# Patient Record
Sex: Male | Born: 1960 | Race: White | Hispanic: No | Marital: Single | Smoking: Current every day smoker
Health system: Southern US, Community
[De-identification: ages and names within clinical notes are randomized; demographics above are authoritative.]

---

## 2006-08-16 ENCOUNTER — Ambulatory Visit: Payer: Self-pay | Admitting: Internal Medicine

## 2006-08-17 ENCOUNTER — Ambulatory Visit: Payer: Self-pay | Admitting: *Deleted

## 2006-08-17 ENCOUNTER — Ambulatory Visit (HOSPITAL_COMMUNITY): Admission: RE | Admit: 2006-08-17 | Discharge: 2006-08-17 | Payer: Self-pay | Admitting: Internal Medicine

## 2006-10-12 ENCOUNTER — Emergency Department (HOSPITAL_COMMUNITY): Admission: EM | Admit: 2006-10-12 | Discharge: 2006-10-12 | Payer: Self-pay | Admitting: Family Medicine

## 2015-11-14 ENCOUNTER — Emergency Department (HOSPITAL_COMMUNITY)
Admission: EM | Admit: 2015-11-14 | Discharge: 2015-11-14 | Disposition: A | Discharge status: Home / Self Care | Payer: Self-pay | Attending: Emergency Medicine | Admitting: Emergency Medicine

## 2015-11-14 ENCOUNTER — Encounter (HOSPITAL_COMMUNITY): Payer: Self-pay | Admitting: *Deleted

## 2015-11-14 ENCOUNTER — Encounter (HOSPITAL_COMMUNITY): Payer: Self-pay | Admitting: Emergency Medicine

## 2015-11-14 ENCOUNTER — Ambulatory Visit (HOSPITAL_COMMUNITY)
Admission: EM | Admit: 2015-11-14 | Discharge: 2015-11-14 | Disposition: A | Discharge status: Nursing Home (Includes ICF) | Payer: Self-pay

## 2015-11-14 DIAGNOSIS — B182 Chronic viral hepatitis C: Secondary | ICD-10-CM

## 2015-11-14 DIAGNOSIS — R899 Unspecified abnormal finding in specimens from other organs, systems and tissues: Secondary | ICD-10-CM

## 2015-11-14 DIAGNOSIS — F17200 Nicotine dependence, unspecified, uncomplicated: Secondary | ICD-10-CM | POA: Insufficient documentation

## 2015-11-14 DIAGNOSIS — R799 Abnormal finding of blood chemistry, unspecified: Secondary | ICD-10-CM | POA: Insufficient documentation

## 2015-11-14 LAB — COMPREHENSIVE METABOLIC PANEL
ALBUMIN: 4.3 g/dL (ref 3.5–5.0)
ALK PHOS: 74 U/L (ref 38–126)
ALT: 58 U/L (ref 17–63)
AST: 48 U/L — AB (ref 15–41)
Anion gap: 8 (ref 5–15)
BILIRUBIN TOTAL: 0.9 mg/dL (ref 0.3–1.2)
BUN: 14 mg/dL (ref 6–20)
CALCIUM: 9.6 mg/dL (ref 8.9–10.3)
CO2: 28 mmol/L (ref 22–32)
CREATININE: 0.94 mg/dL (ref 0.61–1.24)
Chloride: 101 mmol/L (ref 101–111)
GFR calc Af Amer: >60 mL/min (ref >60)
GFR calc non Af Amer: >60 mL/min (ref >60)
GLUCOSE: 117 mg/dL — AB (ref 65–99)
POTASSIUM: 4 mmol/L (ref 3.5–5.1)
Sodium: 137 mmol/L (ref 135–145)
TOTAL PROTEIN: 7.2 g/dL (ref 6.5–8.1)

## 2015-11-14 LAB — CBC
HEMATOCRIT: 50 % (ref 39.0–52.0)
HEMOGLOBIN: 17.2 g/dL — AB (ref 13.0–17.0)
MCH: 31 pg (ref 26.0–34.0)
MCHC: 34.4 g/dL (ref 30.0–36.0)
MCV: 90.1 fL (ref 78.0–100.0)
Platelets: 220 10*3/uL (ref 150–400)
RBC: 5.55 MIL/uL (ref 4.22–5.81)
RDW: 13.6 % (ref 11.5–15.5)
WBC: 10.1 10*3/uL (ref 4.0–10.5)

## 2015-11-14 NOTE — ED Notes (Signed)
Patient sent by Dr. Melrose NakayamaKindle [facility] for HEP C workup.   Patient was told when he went to give blood that he is positive for same.

## 2015-11-14 NOTE — ED Provider Notes (Addendum)
[facility]    CSN: [id number] Arrival date & time: 11/14/15  1005  First Provider Contact:  First MD Initiated Contact with Patient 11/14/15 1048        History   Chief Complaint No chief complaint on file.   HPI Vaughan SineRonald W Quang is a 55 y.o. male.    Illness  Severity:  Mild Chronicity:  New Context:  Told 3mos ago he had hep C and now he wants treatment to cure. Associated symptoms: fatigue and nausea   Associated symptoms: no abdominal pain, no fever and no rash     No past medical history on file.  There are no active problems to display for this patient.   No past surgical history on file.     Home Medications    Prior to Admission medications   Not on File    Family History No family history on file.  Social History Social History  Substance Use Topics  . Smoking status: Not on file  . Smokeless tobacco: Not on file  . Alcohol use Not on file     Allergies   Review of patient's allergies indicates not on file.   Review of Systems Review of Systems  Constitutional: Positive for fatigue and unexpected weight change. Negative for fever.  Respiratory: Negative.   Cardiovascular: Negative.   Gastrointestinal: Positive for nausea. Negative for abdominal pain.  Skin: Negative for rash.  Neurological: Positive for weakness.  All other systems reviewed and are negative.    Physical Exam Triage Vital Signs ED Triage Vitals  Enc Vitals Group     BP      Pulse      Resp      Temp      Temp src      SpO2      Weight      Height      Head Circumference      Peak Flow      Pain Score      Pain Loc      Pain Edu?      Excl. in GC?    No data found.   Updated Vital Signs There were no vitals taken for this visit.  Visual Acuity Right Eye Distance:   Left Eye Distance:   Bilateral Distance:    Right Eye Near:   Left Eye Near:    Bilateral Near:     Physical Exam  Constitutional: He is oriented to person,  place, and time. He appears well-developed and well-nourished.  Eyes: Conjunctivae are normal.  Neck: Normal range of motion. Neck supple.  Cardiovascular: Normal rate, regular rhythm and normal heart sounds.   Abdominal: Soft. Bowel sounds are normal. He exhibits no mass. There is no tenderness.  Lymphadenopathy:    He has no cervical adenopathy.  Neurological: He is alert and oriented to person, place, and time.  Skin: Skin is warm.  Nursing note and vitals reviewed.    UC Treatments / Results  Labs (all labs ordered are listed, but only abnormal results are displayed) Labs Reviewed - No data to display  EKG  EKG Interpretation None       Radiology No results found.  Procedures Procedures (including critical care time)  Medications Ordered in UC Medications - No data to display   Initial Impression / Assessment and Plan / UC Course  I have reviewed the triage vital signs and the nursing notes.  Pertinent labs & imaging results that were available  during my care of the patient were reviewed by me and considered in my medical decision making (see chart for details).  Clinical Course    Sent for eval of poss hepc.  Final Clinical Impressions(s) / UC Diagnoses   Final diagnoses:  None    New Prescriptions New Prescriptions   No medications on file     Linna Hoff, MD 11/14/15 1107    Linna Hoff, MD 11/14/15 1109

## 2015-11-14 NOTE — ED Triage Notes (Signed)
Pt  Reports weakness   Pt  Was  Told  By    [facility]   [facility]  He  Had  Hep  c    He  Reports   He  Has  Had  A  Foul  Odor  At  Time  When  He  Was   Sweating     He  Is   awke  And  Alert  And  Oriented

## 2015-11-14 NOTE — ED Triage Notes (Signed)
Went to give plasma and was told he had Hep C,  Needs to get set up with dr,

## 2015-11-14 NOTE — ED Notes (Signed)
Report  Phoned  To   Amy  Nurse first    

## 2015-11-14 NOTE — ED Provider Notes (Signed)
[facility]   CSN: [id number] Arrival date & time: 11/14/15  1129  First Provider Contact:  None       History   Chief Complaint Chief Complaint  Patient presents with  . Abnormal Lab    HPI Vaughan SineRonald W Weatherbee is a 55 y.o. male.  Patient presents to the ED with a chief complaint of "I was told I have hepatitis C."  He states that he [facility].  Patient states that he was giving blood a few weeks ago and was told that he had Hep C.  States that his only complaints are that it feels like his "bone marrow is melting" and that he has foul smelling sweat.  He denies fevers, chills, nausea, vomiting, diarrhea.  He states that he has had a mild cough and cold symptoms recently.  He reports having dark urine.  He states that he drinks an occasional beer.   Denies any IV drug use.  Denies any known contacts/exposures to Hep C.   The history is provided by the patient. No language interpreter was used.    History reviewed. No pertinent past medical history.  There are no active problems to display for this patient.   History reviewed. No pertinent surgical history.     Home Medications    Prior to Admission medications   Not on File    Family History History reviewed. No pertinent family history.  Social History Social History  Substance Use Topics  . Smoking status: Current Every Day Smoker  . Smokeless tobacco: Never Used  . Alcohol use No     Allergies   Shellfish allergy   Review of Systems Review of Systems  HENT: Positive for rhinorrhea.   Respiratory: Positive for cough.   All other systems reviewed and are negative.    Physical Exam Updated Vital Signs BP 106/69 (BP Location: Right Arm)   Pulse 73   Temp 99.6 F (37.6 C) (Oral)   Resp 18   SpO2 98%   Physical Exam Physical Exam  Constitutional: Pt appears well-developed and well-nourished. No distress.  Awake, alert, nontoxic  appearance  HENT:  Head: Normocephalic and atraumatic.  Mouth/Throat: Oropharynx is clear and moist. No oropharyngeal exudate.  Eyes: Conjunctivae are normal. No scleral icterus.  Neck: Normal range of motion. Neck supple.  Cardiovascular: Normal rate, regular rhythm and intact distal pulses.   Pulmonary/Chest: Effort normal and breath sounds normal. No respiratory distress. Pt has no wheezes.  Equal chest expansion  Abdominal: Soft. Bowel sounds are normal. Pt exhibits no mass. No focal abdominal tenderness, no RLQ tenderness or pain at McBurney's point, no RUQ tenderness or Murphy's sign, no left-sided abdominal tenderness, no fluid wave, or signs of peritonitis  Musculoskeletal: Normal range of motion. Pt exhibits no edema.  Neurological: Pt is alert.  Speech is clear and goal oriented Moves extremities without ataxia  Skin: Skin is warm and dry. Pt is not diaphoretic.  Psychiatric: Pt has a normal mood and affect.  Nursing note and vitals reviewed.    ED Treatments / Results  Labs (all labs ordered are listed, but only abnormal results are displayed) Labs Reviewed  CBC - Abnormal; Notable for the following:       Result Value   Hemoglobin 17.2 (*)    All other components within normal limits  COMPREHENSIVE METABOLIC PANEL - Abnormal; Notable for the following:    Glucose, Bld 117 (*)  AST 48 (*)    All other components within normal limits  HEPATITIS PANEL, ACUTE    EKG  EKG Interpretation None       Radiology No results found.  Procedures Procedures (including critical care time)  Medications Ordered in ED Medications - No data to display   Initial Impression / Assessment and Plan / ED Course  I have reviewed the triage vital signs and the nursing notes.  Pertinent labs & imaging results that were available during my care of the patient were reviewed by me and considered in my medical decision making (see chart for details).  Clinical Course     Patient sent to ED by [facility] for Hep C workup.  Will check basic labs to ensure to failure.  Check Hep Panel and discuss with Case Management and help facilitate patient seeing GI.  Patient to call and speak with social work on Monday morning to renew orange card, then contact GI.    Hepatitis panel ordered to assist GI.  LFTs today on non-concerning.  Final Clinical Impressions(s) / ED Diagnoses   Final diagnoses:  Abnormal laboratory test    New Prescriptions New Prescriptions   No medications on file     Roxy Horseman, PA-C 11/14/15 1836    Marily Memos, MD 11/15/15 9604

## 2015-11-15 LAB — HEPATITIS PANEL, ACUTE
HCV Ab: >11 s/co ratio — ABNORMAL HIGH (ref 0.0–0.9)
Hep A IgM: NEGATIVE
Hep B C IgM: NEGATIVE
Hepatitis B Surface Ag: NEGATIVE

## 2015-11-17 ENCOUNTER — Telehealth (HOSPITAL_BASED_OUTPATIENT_CLINIC_OR_DEPARTMENT_OTHER): Payer: Self-pay | Admitting: Emergency Medicine

## 2015-11-17 NOTE — Telephone Encounter (Signed)
+   AntiHCV resulted, according to notes, was sent for workup for hepatitis, referred to GI MD for followup

## 2017-07-13 ENCOUNTER — Encounter (HOSPITAL_COMMUNITY): Payer: Self-pay | Admitting: Emergency Medicine

## 2017-07-13 ENCOUNTER — Ambulatory Visit (HOSPITAL_COMMUNITY)
Admission: EM | Admit: 2017-07-13 | Discharge: 2017-07-13 | Disposition: A | Discharge status: Home / Self Care | Payer: Self-pay | Attending: Family Medicine | Admitting: Family Medicine

## 2017-07-13 ENCOUNTER — Other Ambulatory Visit: Payer: Self-pay

## 2017-07-13 DIAGNOSIS — J0100 Acute maxillary sinusitis, unspecified: Secondary | ICD-10-CM

## 2017-07-13 DIAGNOSIS — L03113 Cellulitis of right upper limb: Secondary | ICD-10-CM

## 2017-07-13 MED ORDER — AMOXICILLIN-POT CLAVULANATE 875-125 MG PO TABS: 1.0000 | ORAL_TABLET | Freq: Two times a day (BID) | ORAL | 0 refills | Status: DC

## 2017-07-13 NOTE — ED Provider Notes (Signed)
[facility]   [id number] 07/13/17 Arrival Time: 1025  ASSESSMENT & PLAN:  1. Acute non-recurrent maxillary sinusitis   2. Cellulitis of right arm     Meds ordered this encounter  Medications  . amoxicillin-clavulanate (AUGMENTIN) 875-125 MG tablet    Sig: Take 1 tablet by mouth every 12 (twelve) hours.    Dispense:  20 tablet    Refill:  0   Watch area on R forearm. Will f/u if not seeing improvement within the next 2-3 days, sooner if needed.  Discussed typical duration of symptoms. OTC symptom care as needed. Ensure adequate fluid intake and rest. May f/u with PCP or here as needed.  Reviewed expectations re: course of current medical issues. Questions answered. Outlined signs and symptoms indicating need for more acute intervention. Patient verbalized understanding. After Visit Summary given.   SUBJECTIVE: History from: patient.  Matthew Mills is a 57 y.o. male who presents with complaint of nasal congestion, post-nasal drainage, and sinus pain. Onset gradual, approximately 3 weeks ago. Sinus pressure bothering him the most now. Respiratory symptoms: none Fever: no. Overall normal PO intake without n/v. OTC treatment: none. Also reports "spider bite" on his R forearm. Noticed 1-2 days ago. Slight increase in size and erythema. History of frequent sinus infections: no.  Social History   Tobacco Use  Smoking Status Current Every Day Smoker  Smokeless Tobacco Never Used    ROS: As per HPI.   OBJECTIVE:  Vitals:   07/13/17 1055  BP: 104/69  Pulse: 94  Resp: 20  Temp: 97.6 F (36.4 C)  TempSrc: Oral  SpO2: 99%     General appearance: alert; appears fatigued HEENT: nasal congestion; clear runny nose; throat irritation secondary to post-nasal drainage; bilateral maxillary tenderness to palpation; turbinates boggy Neck: supple without LAD Lungs: unlabored respirations, symmetrical air entry; cough: mild; no respiratory distress Skin: warm and  dry; R forearm with 1-1.5 cm area of erythema without fluctuance Psychological: alert and cooperative; normal mood and affect  Allergies  Allergen Reactions  . Shellfish Allergy     Social History   Socioeconomic History  . Marital status: Single    Spouse name: Not on file  . Number of children: Not on file  . Years of education: Not on file  . Highest education level: Not on file  Occupational History  . Not on file  Social Needs  . Financial resource strain: Not on file  . Food insecurity:    Worry: Not on file    Inability: Not on file  . Transportation needs:    Medical: Not on file    Non-medical: Not on file  Tobacco Use  . Smoking status: Current Every Day Smoker  . Smokeless tobacco: Never Used  Substance and Sexual Activity  . Alcohol use: No  . Drug use: Not on file  . Sexual activity: Not on file  Lifestyle  . Physical activity:    Days per week: Not on file    Minutes per session: Not on file  . Stress: Not on file  Relationships  . Social connections:    Talks on phone: Not on file    Gets together: Not on file    Attends religious service: Not on file    Active member of club or organization: Not on file    Attends meetings of clubs or organizations: Not on file    Relationship status: Not on file  . Intimate partner violence:    Fear of  current or ex partner: Not on file    Emotionally abused: Not on file    Physically abused: Not on file    Forced sexual activity: Not on file  Other Topics Concern  . Not on file  Social History Narrative  . Not on file            Mardella Layman, MD 07/13/17 1154

## 2017-07-13 NOTE — ED Triage Notes (Signed)
Sinus congestion for 3 weeks.  Facial pain, particularly on the left face.  Reports brown nasal congestion.    Patient is also concerned for "spider bites" on right arm, noticed 3 weeks ago.

## 2019-12-07 ENCOUNTER — Encounter (HOSPITAL_COMMUNITY): Payer: Self-pay | Admitting: Emergency Medicine

## 2019-12-07 ENCOUNTER — Ambulatory Visit (HOSPITAL_COMMUNITY)
Admission: EM | Admit: 2019-12-07 | Discharge: 2019-12-07 | Disposition: A | Discharge status: Home / Self Care | Payer: Self-pay

## 2019-12-07 ENCOUNTER — Encounter (HOSPITAL_COMMUNITY): Payer: Self-pay

## 2019-12-07 ENCOUNTER — Other Ambulatory Visit: Payer: Self-pay

## 2019-12-07 ENCOUNTER — Inpatient Hospital Stay (HOSPITAL_COMMUNITY)
Admission: EM | Admit: 2019-12-07 | Discharge: 2019-12-09 | DRG: 446 | Discharge status: Against Medical Advice | Payer: Self-pay | Attending: Internal Medicine | Admitting: Internal Medicine

## 2019-12-07 DIAGNOSIS — K8071 Calculus of gallbladder and bile duct without cholecystitis with obstruction: Principal | ICD-10-CM | POA: Diagnosis present

## 2019-12-07 DIAGNOSIS — R531 Weakness: Secondary | ICD-10-CM

## 2019-12-07 DIAGNOSIS — F17210 Nicotine dependence, cigarettes, uncomplicated: Secondary | ICD-10-CM | POA: Diagnosis present

## 2019-12-07 DIAGNOSIS — Z9889 Other specified postprocedural states: Secondary | ICD-10-CM

## 2019-12-07 DIAGNOSIS — K828 Other specified diseases of gallbladder: Secondary | ICD-10-CM | POA: Diagnosis present

## 2019-12-07 DIAGNOSIS — Z8619 Personal history of other infectious and parasitic diseases: Secondary | ICD-10-CM

## 2019-12-07 DIAGNOSIS — R17 Unspecified jaundice: Secondary | ICD-10-CM

## 2019-12-07 DIAGNOSIS — K831 Obstruction of bile duct: Secondary | ICD-10-CM | POA: Diagnosis present

## 2019-12-07 DIAGNOSIS — K8051 Calculus of bile duct without cholangitis or cholecystitis with obstruction: Secondary | ICD-10-CM

## 2019-12-07 DIAGNOSIS — Z91013 Allergy to seafood: Secondary | ICD-10-CM

## 2019-12-07 DIAGNOSIS — Z20822 Contact with and (suspected) exposure to covid-19: Secondary | ICD-10-CM | POA: Diagnosis present

## 2019-12-07 DIAGNOSIS — B182 Chronic viral hepatitis C: Secondary | ICD-10-CM | POA: Diagnosis present

## 2019-12-07 LAB — URINALYSIS, ROUTINE W REFLEX MICROSCOPIC
Glucose, UA: NEGATIVE mg/dL
Hgb urine dipstick: NEGATIVE
Ketones, ur: NEGATIVE mg/dL
Leukocytes,Ua: NEGATIVE
Nitrite: NEGATIVE
Protein, ur: NEGATIVE mg/dL
Specific Gravity, Urine: 1.014 (ref 1.005–1.030)
pH: 6 (ref 5.0–8.0)

## 2019-12-07 LAB — COMPREHENSIVE METABOLIC PANEL
ALT: 104 U/L — ABNORMAL HIGH (ref 0–44)
AST: 73 U/L — ABNORMAL HIGH (ref 15–41)
Albumin: 3.4 g/dL — ABNORMAL LOW (ref 3.5–5.0)
Alkaline Phosphatase: 267 U/L — ABNORMAL HIGH (ref 38–126)
Anion gap: 11 (ref 5–15)
BUN: 11 mg/dL (ref 6–20)
CO2: 25 mmol/L (ref 22–32)
Calcium: 9.3 mg/dL (ref 8.9–10.3)
Chloride: 98 mmol/L (ref 98–111)
Creatinine, Ser: 1.03 mg/dL (ref 0.61–1.24)
GFR calc Af Amer: >60 mL/min (ref >60)
GFR calc non Af Amer: >60 mL/min (ref >60)
Glucose, Bld: 92 mg/dL (ref 70–99)
Potassium: 4.3 mmol/L (ref 3.5–5.1)
Sodium: 134 mmol/L — ABNORMAL LOW (ref 135–145)
Total Bilirubin: 12.5 mg/dL — ABNORMAL HIGH (ref 0.3–1.2)
Total Protein: 6.9 g/dL (ref 6.5–8.1)

## 2019-12-07 LAB — CBC
HCT: 49.1 % (ref 39.0–52.0)
Hemoglobin: 16.4 g/dL (ref 13.0–17.0)
MCH: 30.7 pg (ref 26.0–34.0)
MCHC: 33.4 g/dL (ref 30.0–36.0)
MCV: 91.8 fL (ref 80.0–100.0)
Platelets: ADEQUATE 10*3/uL (ref 150–400)
RBC: 5.35 MIL/uL (ref 4.22–5.81)
RDW: 13.5 % (ref 11.5–15.5)
WBC: 11.2 10*3/uL — ABNORMAL HIGH (ref 4.0–10.5)
nRBC: 0 % (ref 0.0–0.2)

## 2019-12-07 LAB — LIPASE, BLOOD: Lipase: 28 U/L (ref 11–51)

## 2019-12-07 NOTE — ED Triage Notes (Signed)
Patient presents to [facility] for assessment of "years" of every August having nasal congestion, sinus pressure, a burning sensation to his epigastric region, orange urine, "deep cough" with mucous production.

## 2019-12-07 NOTE — ED Triage Notes (Signed)
Pt sent here from [facility] for blood work to rule out Hepatitis C. Pt reports dark orange urine x2 years. He was told several years ago he needed to be evaluated for Hep C. Pt reports ongoing dizziness and foul body odor

## 2019-12-07 NOTE — ED Provider Notes (Signed)
[facility]    CSN: [id number] Arrival date & time: 12/07/19  1146      History   Chief Complaint Chief Complaint  Patient presents with  . multiple complaints    HPI Matthew Mills is a 59 y.o. male.   Here today with several concerns. He states overall the past few years he feels like something is eating away at his body. States he can smell the infection coming from his body, like something is feeding on his body. He's also having intermittent weakness, especially when first standing up to walk notes he wobbles and nearly falls. Has had syphilis around age 48 but was treated for this with resolution. States he can smell the infection coming from his body the last few years, like something is feeding on his body. Hx of crack cocaine use, alcohol abuse here and there but no substance use/abuse in the last 20 years. He notes he's worried about hepatitis C - gave blood back in 2017 and was told he was positive for screening for hepatitis, then was seen at [facility] and subsequently the ED with confirmation of Hep C. He says nobody told him anything at that time so he has been lost to f/u since. Having epigastric pain, orange urine, and fatigue.   Also c/o every fall he starts getting worsening congestion, and sinus headaches. Typically takes augmentin when this happens with good relief. Not trying anything over the counter at this time for this.       History reviewed. No pertinent past medical history.  There are no problems to display for this patient.   History reviewed. No pertinent surgical history.     Home Medications    Prior to Admission medications   Medication Sig Start Date End Date Taking? Authorizing Provider  amoxicillin-clavulanate (AUGMENTIN) 875-125 MG tablet Take 1 tablet by mouth every 12 (twelve) hours. 07/13/17   Mardella Layman, MD    Family History History reviewed. No pertinent family history.  Social History Social History   Tobacco Use    . Smoking status: Current Every Day Smoker    Packs/day: 0.50  . Smokeless tobacco: Never Used  Substance Use Topics  . Alcohol use: No  . Drug use: Never     Allergies   Shellfish allergy   Review of Systems Review of Systems  Constitutional: Positive for fatigue.  HENT: Positive for congestion.   Eyes: Negative.   Respiratory: Negative.   Cardiovascular: Negative.   Gastrointestinal: Positive for abdominal pain.  Genitourinary: Negative.        Urine discoloration  Musculoskeletal: Negative.   Skin: Negative.   Neurological: Negative.   Psychiatric/Behavioral: Negative.      Physical Exam Triage Vital Signs ED Triage Vitals  Enc Vitals Group     BP 12/07/19 1253 116/69     Pulse Rate 12/07/19 1253 98     Resp 12/07/19 1253 16     Temp 12/07/19 1253 97.9 F (36.6 C)     Temp Source 12/07/19 1253 Oral     SpO2 12/07/19 1253 100 %     Weight --      Height --      Head Circumference --      Peak Flow --      Pain Score 12/07/19 1254 4     Pain Loc --      Pain Edu? --      Excl. in GC? --    No data found.  Updated  Vital Signs BP 116/69 (BP Location: Left Arm)   Pulse 98   Temp 97.9 F (36.6 C) (Oral)   Resp 16   SpO2 100%   Visual Acuity Right Eye Distance:   Left Eye Distance:   Bilateral Distance:    Right Eye Near:   Left Eye Near:    Bilateral Near:     Physical Exam Vitals and nursing note reviewed.  Constitutional:      Comments: Appears underweight, anxious  HENT:     Head: Atraumatic.     Right Ear: Tympanic membrane normal.     Left Ear: Tympanic membrane normal.     Nose: Nose normal.     Mouth/Throat:     Mouth: Mucous membranes are moist.     Pharynx: Oropharynx is clear.  Eyes:     General: Scleral icterus (b/l) present.  Cardiovascular:     Rate and Rhythm: Normal rate.     Heart sounds: Normal heart sounds.  Pulmonary:     Effort: Pulmonary effort is normal. No respiratory distress.     Breath sounds: Normal  breath sounds.  Abdominal:     General: Bowel sounds are normal.     Palpations: Abdomen is soft.     Tenderness: There is abdominal tenderness (mild epigastric ttp). There is no guarding.  Musculoskeletal:        General: Normal range of motion.     Cervical back: Normal range of motion and neck supple.  Skin:    Coloration: Skin is jaundiced.  Neurological:     Mental Status: He is alert and oriented to person, place, and time.  Psychiatric:     Comments: anxious    UC Treatments / Results  Labs (all labs ordered are listed, but only abnormal results are displayed) Labs Reviewed - No data to display  EKG   Radiology No results found.  Procedures Procedures (including critical care time)  Medications Ordered in UC Medications - No data to display  Initial Impression / Assessment and Plan / UC Course  I have reviewed the triage vital signs and the nursing notes.  Pertinent labs & imaging results that were available during my care of the patient were reviewed by me and considered in my medical decision making (see chart for details).     Given patient's visible jaundice, concerns of orange urine, abdominal pain, weakness and shakes, recommended he present to the ED right away for further workup to evaluate the extent of his hepatitis C and check stat labs. He does not have a primary care provider and has not had labs in several years, though per chart review positive for hep c and elevated ast back in 2017. He is agreeable to this plan and will present to [facility] ED.   Final Clinical Impressions(s) / UC Diagnoses   Final diagnoses:  Jaundice  History of positive hepatitis C  Weakness   Discharge Instructions   None    ED Prescriptions    None     PDMP not reviewed this encounter.   Particia Nearing, [place] 12/07/19 1417

## 2019-12-08 ENCOUNTER — Emergency Department (HOSPITAL_COMMUNITY): Payer: Self-pay

## 2019-12-08 ENCOUNTER — Inpatient Hospital Stay (HOSPITAL_COMMUNITY): Payer: Self-pay | Admitting: Anesthesiology

## 2019-12-08 ENCOUNTER — Other Ambulatory Visit: Payer: Self-pay

## 2019-12-08 ENCOUNTER — Encounter (HOSPITAL_COMMUNITY)
Admission: EM | Discharge status: Against Medical Advice | Payer: Self-pay | Source: Home / Self Care | Attending: Internal Medicine

## 2019-12-08 ENCOUNTER — Encounter (HOSPITAL_COMMUNITY): Payer: Self-pay | Admitting: Internal Medicine

## 2019-12-08 ENCOUNTER — Inpatient Hospital Stay (HOSPITAL_COMMUNITY): Payer: Self-pay

## 2019-12-08 DIAGNOSIS — B182 Chronic viral hepatitis C: Secondary | ICD-10-CM

## 2019-12-08 DIAGNOSIS — K831 Obstruction of bile duct: Secondary | ICD-10-CM

## 2019-12-08 DIAGNOSIS — K8051 Calculus of bile duct without cholangitis or cholecystitis with obstruction: Secondary | ICD-10-CM

## 2019-12-08 DIAGNOSIS — R17 Unspecified jaundice: Secondary | ICD-10-CM

## 2019-12-08 HISTORY — PX: SPHINCTEROTOMY: SHX5544

## 2019-12-08 HISTORY — PX: ERCP: SHX5425

## 2019-12-08 HISTORY — PX: REMOVAL OF STONES: SHX5545

## 2019-12-08 LAB — HEPATITIS B SURFACE ANTIGEN: Hepatitis B Surface Ag: NONREACTIVE

## 2019-12-08 LAB — APTT: aPTT: 30 seconds (ref 24–36)

## 2019-12-08 LAB — CBG MONITORING, ED: Glucose-Capillary: 104 mg/dL — ABNORMAL HIGH (ref 70–99)

## 2019-12-08 LAB — ACETAMINOPHEN LEVEL: Acetaminophen (Tylenol), Serum: <10 ug/mL — ABNORMAL LOW (ref 10–30)

## 2019-12-08 LAB — PROTIME-INR
INR: 1 (ref 0.8–1.2)
Prothrombin Time: 12.6 seconds (ref 11.4–15.2)

## 2019-12-08 LAB — HEPATITIS PANEL, ACUTE
HCV Ab: REACTIVE — AB
Hep A IgM: NONREACTIVE
Hep B C IgM: NONREACTIVE
Hepatitis B Surface Ag: NONREACTIVE

## 2019-12-08 LAB — HEMOGLOBIN A1C
Hgb A1c MFr Bld: 5.5 % (ref 4.8–5.6)
Mean Plasma Glucose: 111.15 mg/dL

## 2019-12-08 LAB — HEPATITIS C ANTIBODY: HCV Ab: REACTIVE — AB

## 2019-12-08 LAB — HIV ANTIBODY (ROUTINE TESTING W REFLEX): HIV Screen 4th Generation wRfx: NONREACTIVE

## 2019-12-08 LAB — SARS CORONAVIRUS 2 BY RT PCR (HOSPITAL ORDER, PERFORMED IN ~~LOC~~ HOSPITAL LAB): SARS Coronavirus 2: NEGATIVE

## 2019-12-08 SURGERY — ERCP, WITH INTERVENTION IF INDICATED
Anesthesia: General

## 2019-12-08 MED ORDER — DEXAMETHASONE SODIUM PHOSPHATE 10 MG/ML IJ SOLN
INTRAMUSCULAR | Status: DC | PRN
  Administered 2019-12-08: 5 mg via INTRAVENOUS

## 2019-12-08 MED ORDER — SUGAMMADEX SODIUM 200 MG/2ML IV SOLN
INTRAVENOUS | Status: DC | PRN
  Administered 2019-12-08: 200 mg via INTRAVENOUS

## 2019-12-08 MED ORDER — ACETAMINOPHEN 10 MG/ML IV SOLN: 1000.0000 mg | Freq: Once | INTRAVENOUS | Status: DC | PRN

## 2019-12-08 MED ORDER — DEXTROSE-NACL 5-0.45 % IV SOLN: INTRAVENOUS | Status: DC

## 2019-12-08 MED ORDER — LACTATED RINGERS IV SOLN: INTRAVENOUS | Status: DC

## 2019-12-08 MED ORDER — FENTANYL CITRATE (PF) 250 MCG/5ML IJ SOLN
INTRAMUSCULAR | Status: AC
Start: 2019-12-08 — End: ?
  Filled 2019-12-08: qty 5

## 2019-12-08 MED ORDER — PROPOFOL 10 MG/ML IV BOLUS
INTRAVENOUS | Status: AC
  Filled 2019-12-08: qty 20

## 2019-12-08 MED ORDER — MIDAZOLAM HCL 5 MG/5ML IJ SOLN
INTRAMUSCULAR | Status: DC | PRN
  Administered 2019-12-08: 2 mg via INTRAVENOUS

## 2019-12-08 MED ORDER — INDOMETHACIN 50 MG RE SUPP
100.0000 mg | Freq: Once | RECTAL | Status: DC
  Filled 2019-12-08: qty 2

## 2019-12-08 MED ORDER — SODIUM CHLORIDE 0.9 % IV SOLN
1.5000 g | Freq: Three times a day (TID) | INTRAVENOUS | Status: DC
  Administered 2019-12-08 – 2019-12-09 (×2): 1.5 g via INTRAVENOUS
  Filled 2019-12-08 (×4): qty 4

## 2019-12-08 MED ORDER — IOHEXOL 300 MG/ML  SOLN
100.0000 mL | Freq: Once | INTRAMUSCULAR | Status: AC | PRN
  Administered 2019-12-08: 100 mL via INTRAVENOUS

## 2019-12-08 MED ORDER — MIDAZOLAM HCL 2 MG/2ML IJ SOLN
INTRAMUSCULAR | Status: AC
  Filled 2019-12-08: qty 2

## 2019-12-08 MED ORDER — LACTATED RINGERS IV SOLN: INTRAVENOUS | Status: DC | PRN

## 2019-12-08 MED ORDER — LIDOCAINE 2% (20 MG/ML) 5 ML SYRINGE
INTRAMUSCULAR | Status: DC | PRN
  Administered 2019-12-08: 40 mg via INTRAVENOUS

## 2019-12-08 MED ORDER — FENTANYL CITRATE (PF) 100 MCG/2ML IJ SOLN: 25.0000 ug | INTRAMUSCULAR | Status: DC | PRN

## 2019-12-08 MED ORDER — GLUCAGON HCL RDNA (DIAGNOSTIC) 1 MG IJ SOLR
INTRAMUSCULAR | Status: DC | PRN
  Administered 2019-12-08: .25 mg via INTRAVENOUS

## 2019-12-08 MED ORDER — ROCURONIUM BROMIDE 10 MG/ML (PF) SYRINGE
PREFILLED_SYRINGE | INTRAVENOUS | Status: DC | PRN
  Administered 2019-12-08: 50 mg via INTRAVENOUS

## 2019-12-08 MED ORDER — AMISULPRIDE (ANTIEMETIC) 5 MG/2ML IV SOLN: 10.0000 mg | Freq: Once | INTRAVENOUS | Status: DC | PRN

## 2019-12-08 MED ORDER — MEPERIDINE HCL 100 MG/ML IJ SOLN: 6.2500 mg | INTRAMUSCULAR | Status: DC | PRN

## 2019-12-08 MED ORDER — ACETAMINOPHEN 160 MG/5ML PO SOLN: 325.0000 mg | Freq: Once | ORAL | Status: DC | PRN

## 2019-12-08 MED ORDER — ENOXAPARIN SODIUM 40 MG/0.4ML ~~LOC~~ SOLN
40.0000 mg | SUBCUTANEOUS | Status: DC
  Administered 2019-12-08: 40 mg via SUBCUTANEOUS
  Filled 2019-12-08: qty 0.4

## 2019-12-08 MED ORDER — POLYETHYLENE GLYCOL 3350 17 G PO PACK: 17.0000 g | PACK | Freq: Every day | ORAL | Status: DC | PRN

## 2019-12-08 MED ORDER — FENTANYL CITRATE (PF) 100 MCG/2ML IJ SOLN
INTRAMUSCULAR | Status: DC | PRN
Start: 2019-12-08 — End: 2019-12-08
  Administered 2019-12-08: 50 ug via INTRAVENOUS

## 2019-12-08 MED ORDER — SODIUM CHLORIDE 0.9 % IV SOLN
1.5000 g | INTRAVENOUS | Status: AC
  Administered 2019-12-08: 1.5 g via INTRAVENOUS
  Filled 2019-12-08: qty 1.5

## 2019-12-08 MED ORDER — CIPROFLOXACIN IN D5W 400 MG/200ML IV SOLN
INTRAVENOUS | Status: AC
  Filled 2019-12-08: qty 200

## 2019-12-08 MED ORDER — ACETAMINOPHEN 325 MG PO TABS: 325.0000 mg | ORAL_TABLET | Freq: Once | ORAL | Status: DC | PRN

## 2019-12-08 MED ORDER — SODIUM CHLORIDE (PF) 0.9 % IJ SOLN
INTRAMUSCULAR | Status: DC | PRN
  Administered 2019-12-08: 15 mL

## 2019-12-08 MED ORDER — SODIUM CHLORIDE 0.9% FLUSH
3.0000 mL | Freq: Two times a day (BID) | INTRAVENOUS | Status: DC
  Administered 2019-12-08 – 2019-12-09 (×3): 3 mL via INTRAVENOUS

## 2019-12-08 MED ORDER — INDOMETHACIN 50 MG RE SUPP
RECTAL | Status: AC
  Filled 2019-12-08: qty 2

## 2019-12-08 MED ORDER — ONDANSETRON HCL 4 MG/2ML IJ SOLN: 4.0000 mg | Freq: Four times a day (QID) | INTRAMUSCULAR | Status: DC | PRN

## 2019-12-08 MED ORDER — GLUCAGON HCL RDNA (DIAGNOSTIC) 1 MG IJ SOLR
INTRAMUSCULAR | Status: AC
  Filled 2019-12-08: qty 1

## 2019-12-08 MED ORDER — ONDANSETRON HCL 4 MG PO TABS: 4.0000 mg | ORAL_TABLET | Freq: Four times a day (QID) | ORAL | Status: DC | PRN

## 2019-12-08 MED ORDER — PROPOFOL 10 MG/ML IV BOLUS
INTRAVENOUS | Status: DC | PRN
  Administered 2019-12-08: 160 mg via INTRAVENOUS
  Administered 2019-12-08: 40 mg via INTRAVENOUS

## 2019-12-08 MED ORDER — PHENYLEPHRINE 40 MCG/ML (10ML) SYRINGE FOR IV PUSH (FOR BLOOD PRESSURE SUPPORT)
PREFILLED_SYRINGE | INTRAVENOUS | Status: DC | PRN
  Administered 2019-12-08: 80 ug via INTRAVENOUS
  Administered 2019-12-08 (×3): 120 ug via INTRAVENOUS

## 2019-12-08 MED ORDER — ONDANSETRON HCL 4 MG/2ML IJ SOLN
INTRAMUSCULAR | Status: DC | PRN
  Administered 2019-12-08: 4 mg via INTRAVENOUS

## 2019-12-08 MED ORDER — INDOMETHACIN 50 MG RE SUPP
RECTAL | Status: DC | PRN
  Administered 2019-12-08: 100 mg via RECTAL

## 2019-12-08 NOTE — ED Notes (Signed)
Pt transported to CT ?

## 2019-12-08 NOTE — H&P (Addendum)
Date: 12/08/2019               Patient Name:  Matthew Mills MRN: [id number]  DOB: 04-Feb-1961 Age / Sex: 59 y.o., male   PCP: Patient, No Pcp Per         Medical Service: Internal Medicine Teaching Service         Attending Physician: Dr. Criselda Peaches    First Contact: Dr. Laddie Aquas Pager: [phone]  Second Contact: Dr. Maryla Morrow Pager: [phone]       After Hours (After 5p/  First Contact Pager: [phone]  weekends / holidays): Second Contact Pager: [phone]   Chief Complaint: Upper Abdominal Pain  History of Present Illness:  Matthew Mills is a 59 y.o. M w/ hx of AUD and chronic hepatitis C diagnosed at Urgent Care August 2017 without a PCP or GI follow-up presenting with about 1 week of constant, upper epigastric pain. He states that his pain is better sitting up, worse lying down, and remains unchanged after eating or with other movement. He also endorses 3 to 4 days of yellowing of his skin, eyes, orange urine, orange sputum, and body odor. He says that he experienced a second similar episode of pain and jaundice 2 months ago. He blames being out in the heat recently. He notes chills but denies fever. Endorses 10 lbs of weight loss over the past 2 weeks due to decreased appetite. He also endorses occasional palpitations but denies any history of cardiac disease. He states that every August, he gets a "cold" with congestion and sinus pressure as well as deeper chest cough/sputum. He states his sputum has been dark green and brown with these episodes and states they usually only resolve with antibiotics. He endorses current congestion and orange sputum production. Endorses mild intermittent wheezing with his colds. Denies changes in bowel frequency, color, or consistency. Denies BRBPR or melena. Endorses sleep deprivation due to stress at work, but denies N/V, or any other symptoms.  Meds:  Patient takes a multivitamin daily but no other medications at home.  Allergies: Allergies as of  12/07/2019 - Review Complete 12/07/2019  Allergen Reaction Noted  . Shellfish allergy  11/14/2015  NKDA  Family History:  Patient's grandfather had COPD secondary to toxins, and uncle had kidney stones. No other family hx of pulmonary diseases or cancers.  Social History:  Patient states that he works in [employer] and has frequent exposures to dust, possible asbestos, and blood at work. He smokes 3/4 PPD cigarettes, drinks alcohol on the weekends only (denies history of withdrawal, although notes previous heavy alcohol use in his 20's), states he has tried crack cocaine once, but denies any other drug use or IVDU history.   Review of Systems: A complete ROS was negative except as per HPI.   Physical Exam: Blood pressure 134/77, pulse 60, temperature 97.9 F (36.6 C), temperature source Oral, resp. rate 16, height 5\' 11"  (1.803 m), weight 63.5 kg, SpO2 99 %.  General: Patient appears well. No acute distress. Eyes: Scleral icterus is present. No conjunctival injection.  HENT: Neck is supple. No nasal discharge. No palpable lymphadenopathy. Respiratory: Lungs are CTA, bilaterally. No wheezes, rales, or rhonchi.  Cardiovascular: Regular rate and rhythm. No murmurs, rubs, or gallops. No lower extremity edema. Distal extremities are 2+ in all four extremities.  Abdominal: Soft and scaphoid, with mild epigastric and RUQ TTP and positive Murphy's sign. Bowel sounds intact throughout.  Musculoskeletal: ROMI in all four extremities with normal muscle bulk and  tone throughout. Neurological: Patient is alert and oriented x 3.  Skin: No lesions. No rashes.  Psych: Normal affect. Normal tone of voice.   EKG: personally reviewed my interpretation is NSR at a rate of about 69 bpm. No PR or QTc interval prolongation. No ST segment elevations or depressions. T wave inversions present in leads III and possibly in lead II although no prior EKG's for comparison.   US Abdomen Limited RUQ: personally reviewed  by me. Dilated CBD with gallbladder sludge.   CT Abdomen Pelvis w/ Contrast:  FINDINGS: Lower chest:  Dependent atelectasis.  Hepatobiliary: No focal liver abnormality.Prominent intrahepatic bile duct dilatation. Extrahepatic bile ducts are also dilated to a lesser degree, 11 mm at the upper hilum. There is a rounded density at the lumen encompassed by low-density bile and measuring 4 mm, seen at the level of the ampulla which is bulging into the duodenum. The gallbladder is distended with sludge noted on prior ultrasound. Pancreas: Well seen pancreatic duct.  No acute pancreatitis.  IMPRESSION: Biliary obstruction/dilatation above a 4 mm ductal stone at the ampulla.  Electronically Signed   By: Marnee Spring M.D.   On: 12/08/2019 08:01  Assessment & Plan by Problem: Active Problems:   Biliary obstruction   Calculus of bile duct without cholangitis with obstruction   Jaundice  Obstructive Choledocholithiasis without Cholangitis  Patient presents with about 1 week of epigastric pain and several days of jaundice, orange urine, decreased appetite and 10 lb weight loss. He endorses chills but denies fever. CT ABD/pelvis demonstrated a gallstone at the ampulla with biliary tree dilation and gallbladder sludge. Initial WBC 11.2, ALT 104, AST 73, ALP 267, T. Bili 12.5. U/A amber with bilirubinuria. - Gastroenterology following, appreciate their recommendations  - ERCP was performed today with successful stone removal - Plan is for surgical consult for cholecystectomy  Possible Chronic HCV Per chart review, patient had positive HCV Ab testing in August 2017. 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 HCV Ab testing reactive x 2. Albumin 3.4, Platelet count unable to be determined due to clumping, but count appears normal. PT 12.6, INR 1.0. HBsAg non-reactive. Liver unremarkable on imaging; less concern for early cirrhosis. Patient is afebrile.  -  Check HCV RNA quant   Addendum 12/09/19: Code Status: Full code Diet: Patient made NPO prior to procedure then to advance diet as tolerated 4 hours thereafter. DVT PPx: Lovenox  Dispo: Admit patient to Inpatient with expected length of stay greater than 2 midnights.  Signed: Glenford Bayley, MD 12/08/2019, 5:16 PM  Pager: [phone] After 5pm on weekdays and 1pm on weekends: On Call pager: [phone]

## 2019-12-08 NOTE — Consult Note (Addendum)
Consultation  Referring Provider:  ER MD/Caccavale  Primary Care Physician:  Patient, No Pcp Per Primary Gastroenterologist:  none  Reason for Consultation:  Jaundice  HPI: Matthew Mills is a 59 y.o. male, with no known chronic medical problems who presented to the emergency room last night with complaint of 1 week history of epigastric pain which had been fairly constant and without radiation.  He noticed onset of jaundice and dark urine about 4 days ago which has progressed.  No documented fever but has had some chills.  No nausea or vomiting.  Appetite fair, he has had about a 20 pound weight loss over the past few months.  No change in bowel habits, no melena or hematochezia. He says he has been under a lot of stress recently at work and blames the weight loss on that. Work-up in the emergency room showed WBC of 11.2, hemoglobin 16.4, T bili 12.5/alk phos 267/AST 73/ALT 104, INR 1. Upper abdominal ultrasound showed gallbladder sludge without stones and a common bile duct of 13 mm CT of the abdomen pelvis shows a prominent intrahepatic ductal dilation and CBD of 11 mm.  There is a rounded density in the distal common bile duct measuring 4 mm which is the at the ampulla and appears to be bulging.  The gallbladder is distended with sludge, pancreas unremarkable.   History reviewed. No pertinent past medical history.  History reviewed. No pertinent surgical history.  Prior to Admission medications   Medication Sig Start Date End Date Taking? Authorizing Provider  amoxicillin-clavulanate (AUGMENTIN) 875-125 MG tablet Take 1 tablet by mouth every 12 (twelve) hours. 07/13/17   Vanessa Kick, MD    No current facility-administered medications for this encounter.   Current Outpatient Medications  Medication Sig Dispense Refill  . amoxicillin-clavulanate (AUGMENTIN) 875-125 MG tablet Take 1 tablet by mouth every 12 (twelve) hours. 20 tablet 0    Allergies as of 12/07/2019 - Review  Complete 12/07/2019  Allergen Reaction Noted  . Shellfish allergy  11/14/2015    History reviewed. No pertinent family history.  Social History   Socioeconomic History  . Marital status: Single    Spouse name: Not on file  . Number of children: Not on file  . Years of education: Not on file  . Highest education level: Not on file  Occupational History  . Not on file  Tobacco Use  . Smoking status: Current Every Day Smoker    Packs/day: 0.50  . Smokeless tobacco: Never Used  Substance and Sexual Activity  . Alcohol use: No  . Drug use: Never  . Sexual activity: Not on file  Other Topics Concern  . Not on file  Social History Narrative  . Not on file   Social Determinants of Health   Financial Resource Strain:   . Difficulty of Paying Living Expenses: Not on file  Food Insecurity:   . Worried About Charity fundraiser in the Last Year: Not on file  . Ran Out of Food in the Last Year: Not on file  Transportation Needs:   . Lack of Transportation (Medical): Not on file  . Lack of Transportation (Non-Medical): Not on file  Physical Activity:   . Days of Exercise per Week: Not on file  . Minutes of Exercise per Session: Not on file  Stress:   . Feeling of Stress : Not on file  Social Connections:   . Frequency of Communication with Friends and Family: Not on file  .  Frequency of Social Gatherings with Friends and Family: Not on file  . Attends Religious Services: Not on file  . Active Member of Clubs or Organizations: Not on file  . Attends Archivist Meetings: Not on file  . Marital Status: Not on file  Intimate Partner Violence:   . Fear of Current or Ex-Partner: Not on file  . Emotionally Abused: Not on file  . Physically Abused: Not on file  . Sexually Abused: Not on file    Review of Systems: Pertinent positive and negative review of systems were noted in the above HPI section.  All other review of systems was otherwise negative. Physical  Exam: Vital signs in last 24 hours: Temp:  [97.5 F (36.4 C)-98.4 F (36.9 C)] 98.4 F (36.9 C) (09/04 0505) Pulse Rate:  [63-81] 63 (09/04 0902) Resp:  [15-17] 16 (09/04 0902) BP: (106-128)/(50-85) 117/76 (09/04 0902) SpO2:  [97 %-100 %] 99 % (09/04 0902) Weight:  [65.8 kg] 65.8 kg (09/04 0503)   General:   Alert,  Well-developed, well-nourished, jaundiced white male pleasant and cooperative in NAD Head:  Normocephalic and atraumatic. Eyes:  Sclera icteric conjunctiva pink. Ears:  Normal auditory acuity. Nose:  No deformity, discharge,  or lesions. Mouth:  No deformity or lesions.   Neck:  Supple; no masses or thyromegaly. Lungs:  Clear throughout to auscultation.   No wheezes, crackles, or rhonchi.  Heart:  Regular rate and rhythm; no murmurs, clicks, rubs,  or gallops. Abdomen:  Soft, mildly tender in the epigastrium, no guarding BS active,nonpalp mass or hsm.   Rectal:  Deferred  Msk:  Symmetrical without gross deformities. . Pulses:  Normal pulses noted. Extremities:  Without clubbing or edema. Neurologic:  Alert and  oriented x4;  grossly normal neurologically. Skin: Jaundiced.Marland Kitchen Psych:  Alert and cooperative. Normal mood and affect.  Intake/Output from previous day: No intake/output data recorded. Intake/Output this shift: No intake/output data recorded.  Lab Results: Recent Labs    12/07/19 1534  WBC 11.2*  HGB 16.4  HCT 49.1  PLT PLATELET CLUMPS NOTED ON SMEAR, COUNT APPEARS ADEQUATE   BMET Recent Labs    12/07/19 1534  NA 134*  K 4.3  CL 98  CO2 25  GLUCOSE 92  BUN 11  CREATININE 1.03  CALCIUM 9.3   LFT Recent Labs    12/07/19 1534  PROT 6.9  ALBUMIN 3.4*  AST 73*  ALT 104*  ALKPHOS 267*  BILITOT 12.5*   PT/INR Recent Labs    12/08/19 0635  LABPROT 12.6  INR 1.0   Hepatitis Panel No results for input(s): HEPBSAG, HCVAB, HEPAIGM, HEPBIGM in the last 72 hours.   IMPRESSION:  #12 59 year old white male with 1 week history of  epigastric pain and several day history of onset jaundice and dark urine.  Recent weight loss.  Patient has had some recent chills but no documented fever. Work-up reveals choledocholithiasis and dilated CBD and intrahepatic duct, as well as a distended gallbladder full of sludge.  Patient is jaundiced, nontoxic findings concerning for cholangitis.  #2 weight loss-unclear if related to above, may need further work-up   PLAN: #1 keep n.p.o. #2 patient will be scheduled for ERCP with stone extraction with Dr. Fuller Plan today.  Procedure was discussed in detail with the patient including indications risks and benefits and he is agreeable to proceed.  We specifically discussed risk of pancreatitis, bleeding, perforation, and failed procedure. Further recommendations pending findings at ERCP. Surgical consult post ERCP to discuss laparoscopic  cholecystectomy.  I also spoke to the patient's mother by phone/Matthew Mills [phone], and discussed work-up and findings as well as plans for ERCP in detail.   Amy Esterwood PA-C 12/08/2019, 9:31 AM     Attending Physician Note   I have taken a history, examined the patient and reviewed the chart. I agree with the Advanced Practitioner's note, impression and recommendations.  Impression: Choledocholithiasis, biliary tree dilation and gallbladder sludge on CT AP and elevated LFTs. Symptoms include jaundice, dark urine, epigastric pain and weight loss.   Hep C Ab positive   Recommendation: ERCP today. The risks (including pancreatitis, bleeding, perforation, infection, missed lesions, failed procedure, medication reactions and possible hospitalization or surgery if complications occur), benefits, and alternatives to ERCP with possible sphincterotomy and possible stent placement were discussed with the patient and they consent to proceed.   Send HCV RNA  Surgical consult for consideration of cholecystectomy   Lucio Edward, MD [facility]  Gastroenterology

## 2019-12-08 NOTE — H&P (View-Only) (Signed)
  Consultation  Referring Provider:  ER MD/C[place]ale  Primary Care Physician:  Patient, No Pcp Per Primary Gastroenterologist:  none  Reason for Consultation:  Jaundice  HPI: Matthew Mills is a 59 y.o. male, with no known chronic medical problems who presented to the emergency room last night with complaint of 1 week history of epigastric pain which had been fairly constant and without radiation.  He noticed onset of jaundice and dark urine about 4 days ago which has progressed.  No documented fever but has had some chills.  No nausea or vomiting.  Appetite fair, he has had about a 20 pound weight loss over the past few months.  No change in bowel habits, no melena or hematochezia. He says he has been under a lot of stress recently at work and blames the weight loss on that. Work-up in the emergency room showed WBC of 11.2, hemoglobin 16.4, T bili 12.5/alk phos 267/AST 73/ALT 104, INR 1. Upper abdominal ultrasound showed gallbladder sludge without stones and a common bile duct of 13 mm CT of the abdomen pelvis shows a prominent intrahepatic ductal dilation and CBD of 11 mm.  There is a rounded density in the distal common bile duct measuring 4 mm which is the at the ampulla and appears to be bulging.  The gallbladder is distended with sludge, pancreas unremarkable.   History reviewed. No pertinent past medical history.  History reviewed. No pertinent surgical history.  Prior to Admission medications   Medication Sig Start Date End Date Taking? Authorizing Provider  amoxicillin-clavulanate (AUGMENTIN) 875-125 MG tablet Take 1 tablet by mouth every 12 (twelve) hours. 07/13/17   Hagler, Brian, MD    No current facility-administered medications for this encounter.   Current Outpatient Medications  Medication Sig Dispense Refill  . amoxicillin-clavulanate (AUGMENTIN) 875-125 MG tablet Take 1 tablet by mouth every 12 (twelve) hours. 20 tablet 0    Allergies as of 12/07/2019 - Review  Complete 12/07/2019  Allergen Reaction Noted  . Shellfish allergy  11/14/2015    History reviewed. No pertinent family history.  Social History   Socioeconomic History  . Marital status: Single    Spouse name: Not on file  . Number of children: Not on file  . Years of education: Not on file  . Highest education level: Not on file  Occupational History  . Not on file  Tobacco Use  . Smoking status: Current Every Day Smoker    Packs/day: 0.50  . Smokeless tobacco: Never Used  Substance and Sexual Activity  . Alcohol use: No  . Drug use: Never  . Sexual activity: Not on file  Other Topics Concern  . Not on file  Social History Narrative  . Not on file   Social Determinants of Health   Financial Resource Strain:   . Difficulty of Paying Living Expenses: Not on file  Food Insecurity:   . Worried About Running Out of Food in the Last Year: Not on file  . Ran Out of Food in the Last Year: Not on file  Transportation Needs:   . Lack of Transportation (Medical): Not on file  . Lack of Transportation (Non-Medical): Not on file  Physical Activity:   . Days of Exercise per Week: Not on file  . Minutes of Exercise per Session: Not on file  Stress:   . Feeling of Stress : Not on file  Social Connections:   . Frequency of Communication with Friends and Family: Not on file  .   Frequency of Social Gatherings with Friends and Family: Not on file  . Attends Religious Services: Not on file  . Active Member of Clubs or Organizations: Not on file  . Attends Club or Organization Meetings: Not on file  . Marital Status: Not on file  Intimate Partner Violence:   . Fear of Current or Ex-Partner: Not on file  . Emotionally Abused: Not on file  . Physically Abused: Not on file  . Sexually Abused: Not on file    Review of Systems: Pertinent positive and negative review of systems were noted in the above HPI section.  All other review of systems was otherwise negative. Physical  Exam: Vital signs in last 24 hours: Temp:  [97.5 F (36.4 C)-98.4 F (36.9 C)] 98.4 F (36.9 C) (09/04 0505) Pulse Rate:  [63-81] 63 (09/04 0902) Resp:  [15-17] 16 (09/04 0902) BP: (106-128)/(50-85) 117/76 (09/04 0902) SpO2:  [97 %-100 %] 99 % (09/04 0902) Weight:  [65.8 kg] 65.8 kg (09/04 0503)   General:   Alert,  Well-developed, well-nourished, jaundiced white male pleasant and cooperative in NAD Head:  Normocephalic and atraumatic. Eyes:  Sclera icteric conjunctiva pink. Ears:  Normal auditory acuity. Nose:  No deformity, discharge,  or lesions. Mouth:  No deformity or lesions.   Neck:  Supple; no masses or thyromegaly. Lungs:  Clear throughout to auscultation.   No wheezes, crackles, or rhonchi.  Heart:  Regular rate and rhythm; no murmurs, clicks, rubs,  or gallops. Abdomen:  Soft, mildly tender in the epigastrium, no guarding BS active,nonpalp mass or hsm.   Rectal:  Deferred  Msk:  Symmetrical without gross deformities. . Pulses:  Normal pulses noted. Extremities:  Without clubbing or edema. Neurologic:  Alert and  oriented x4;  grossly normal neurologically. Skin: Jaundiced.. Psych:  Alert and cooperative. Normal mood and affect.  Intake/Output from previous day: No intake/output data recorded. Intake/Output this shift: No intake/output data recorded.  Lab Results: Recent Labs    12/07/19 1534  WBC 11.2*  HGB 16.4  HCT 49.1  PLT PLATELET CLUMPS NOTED ON SMEAR, COUNT APPEARS ADEQUATE   BMET Recent Labs    12/07/19 1534  NA 134*  K 4.3  CL 98  CO2 25  GLUCOSE 92  BUN 11  CREATININE 1.03  CALCIUM 9.3   LFT Recent Labs    12/07/19 1534  PROT 6.9  ALBUMIN 3.4*  AST 73*  ALT 104*  ALKPHOS 267*  BILITOT 12.5*   PT/INR Recent Labs    12/08/19 0635  LABPROT 12.6  INR 1.0   Hepatitis Panel No results for input(s): HEPBSAG, HCVAB, HEPAIGM, HEPBIGM in the last 72 hours.   IMPRESSION:  #1 59-year-old white male with 1 week history of  epigastric pain and several day history of onset jaundice and dark urine.  Recent weight loss.  Patient has had some recent chills but no documented fever. Work-up reveals choledocholithiasis and dilated CBD and intrahepatic duct, as well as a distended gallbladder full of sludge.  Patient is jaundiced, nontoxic findings concerning for cholangitis.  #2 weight loss-unclear if related to above, may need further work-up   PLAN: #1 keep n.p.o. #2 patient will be scheduled for ERCP with stone extraction with Dr. Laticia Vannostrand today.  Procedure was discussed in detail with the patient including indications risks and benefits and he is agreeable to proceed.  We specifically discussed risk of pancreatitis, bleeding, perforation, and failed procedure. Further recommendations pending findings at ERCP. Surgical consult post ERCP to discuss laparoscopic   cholecystectomy.  I also spoke to the patient's mother by phone/Peggy Lineberry [phone], and discussed work-up and findings as well as plans for ERCP in detail.   Amy Esterwood PA-C 12/08/2019, 9:31 AM     Attending Physician Note   I have taken a history, examined the patient and reviewed the chart. I agree with the Advanced Practitioner's note, impression and recommendations.  Impression: Choledocholithiasis, biliary tree dilation and gallbladder sludge on CT AP and elevated LFTs. Symptoms include jaundice, dark urine, epigastric pain and weight loss.   Hep C Ab positive   Recommendation: ERCP today. The risks (including pancreatitis, bleeding, perforation, infection, missed lesions, failed procedure, medication reactions and possible hospitalization or surgery if complications occur), benefits, and alternatives to ERCP with possible sphincterotomy and possible stent placement were discussed with the patient and they consent to proceed.   Send HCV RNA  Surgical consult for consideration of cholecystectomy   Raizel Wesolowski, MD FACG [place]  Gastroenterology   

## 2019-12-08 NOTE — Anesthesia Postprocedure Evaluation (Signed)
Anesthesia Post Note  Patient: LYTLE MALBURG  Procedure(s) Performed: ENDOSCOPIC RETROGRADE CHOLANGIOPANCREATOGRAPHY (ERCP) (N/A ) SPHINCTEROTOMY REMOVAL OF STONES     Patient location during evaluation: PACU Anesthesia Type: General Level of consciousness: awake and alert Pain management: pain level controlled Vital Signs Assessment: post-procedure vital signs reviewed and stable Respiratory status: spontaneous breathing, nonlabored ventilation, respiratory function stable and patient connected to nasal cannula oxygen Cardiovascular status: blood pressure returned to baseline and stable Postop Assessment: no apparent nausea or vomiting Anesthetic complications: no   No complications documented.  Last Vitals:  Vitals:   12/08/19 1545 12/08/19 1607  BP: 131/68 134/77  Pulse: 65 60  Resp: 16 16  Temp: 36.8 C 36.6 C  SpO2: 99% 99%    Last Pain:  Vitals:   12/08/19 1607  TempSrc: Oral  PainSc: 0-No pain                 Shelton Silvas

## 2019-12-08 NOTE — Transfer of Care (Signed)
Immediate Anesthesia Transfer of Care Note  Patient: Matthew Mills  Procedure(s) Performed: ENDOSCOPIC RETROGRADE CHOLANGIOPANCREATOGRAPHY (ERCP) (N/A ) SPHINCTEROTOMY REMOVAL OF STONES  Patient Location: PACU  Anesthesia Type:General  Level of Consciousness: drowsy and patient cooperative  Airway & Oxygen Therapy: Patient Spontanous Breathing and Patient connected to nasal cannula oxygen  Post-op Assessment: Report given to RN, Post -op Vital signs reviewed and stable and Patient moving all extremities  Post vital signs: Reviewed and stable  Last Vitals:  Vitals Value Taken Time  BP 121/72 12/08/19 1516  Temp    Pulse 82 12/08/19 1517  Resp 22 12/08/19 1517  SpO2 98 % 12/08/19 1517  Vitals shown include unvalidated device data.  Last Pain:  Vitals:   12/08/19 1515  TempSrc:   PainSc: 0-No pain         Complications: No complications documented.

## 2019-12-08 NOTE — Progress Notes (Signed)
The patient is pacing around in his room and "wants to go outside and get fresh air".  Dr Donnetta Hutching is aware and reports that she will be up shortly to talk with the patient.

## 2019-12-08 NOTE — ED Notes (Signed)
Stepped outside 

## 2019-12-08 NOTE — ED Provider Notes (Signed)
[facility]   CSN: [id number] Arrival date & time: 12/07/19  1410     History Chief Complaint  Patient presents with  . Labs Only  . Abdominal Pain    Vaughan SineRonald W Degrave is a 59 y.o. male with a history of alcohol use disorder who presents the emergency department with a chief complaint of "I need to be tested for Hepatitis C."  The patient reports that approximately 7 years ago he was donating blood and was informed that he might have hepatitis C and should have further work-up and evaluation.    Per chart review, the patient was seen in urgent care in August 2017 where he had a positive HCV antibody.  He was referred to GI, but never followed up.  The patient reports that over the last 2 weeks that he has had worsening epigastric pain.  He has no pain at this time, but reports that he has barely been able to eat over the last 2 weeks.  He states that he is also concerned about concerns for body odor.  Reports that he has been having orange-colored urine for several years.  When he began having epigastric pain 2 weeks ago, he was concerned that he may be getting a cold because he developed sneezing and rhinorrhea.  The symptoms progressively worsened and he noted that his mucus was orange in color.  He denies fever, chills, but states "I've had a lot of intolerance to cold over the last few years."  No dysuria, back pain, cough, shortness of breath, chest pain, melena, hematochezia, easy bruising, or spontaneous bleeding.  Reports that he has had an unintentional 20 pound weight loss over many months.  He thinks his skin and eyes may be more yellow, but he is unsure.  He is concerned that his symptoms may be related to his multivitamin that he takes.  He denies IV drug use.  He works out of town, but when he returns on the weekend he drinks approximately 12 beers over the course of the weekend.  He denies NSAID use.   Patient is a poor  historian.   HPI     History reviewed. No pertinent past medical history.  There are no problems to display for this patient.   History reviewed. No pertinent surgical history.     History reviewed. No pertinent family history.  Social History   Tobacco Use  . Smoking status: Current Every Day Smoker    Packs/day: 0.50  . Smokeless tobacco: Never Used  Substance Use Topics  . Alcohol use: No  . Drug use: Never    Home Medications Prior to Admission medications   Medication Sig Start Date End Date Taking? Authorizing Provider  amoxicillin-clavulanate (AUGMENTIN) 875-125 MG tablet Take 1 tablet by mouth every 12 (twelve) hours. 07/13/17   Mardella LaymanHagler, Brian, MD    Allergies    Shellfish allergy  Review of Systems   Review of Systems  Constitutional: Positive for unexpected weight change. Negative for appetite change and fever.  HENT: Positive for sneezing.   Respiratory: Negative for cough, shortness of breath and wheezing.   Cardiovascular: Negative for chest pain and palpitations.  Gastrointestinal: Positive for abdominal pain and nausea. Negative for abdominal distention, blood in stool, constipation, diarrhea and vomiting.  Genitourinary: Negative for dysuria, flank pain, frequency, genital sores and urgency.  Musculoskeletal: Negative for back pain, gait problem, joint swelling, myalgias, neck pain and neck stiffness.  Skin: Positive for color  change. Negative for rash and wound.  Allergic/Immunologic: Negative for immunocompromised state.  Neurological: Negative for dizziness, seizures, syncope, speech difficulty, weakness, numbness and headaches.  Psychiatric/Behavioral: Negative for confusion.    Physical Exam Updated Vital Signs BP 117/76 (BP Location: Right Arm)   Pulse 63   Temp 98.4 F (36.9 C) (Oral)   Resp 16   Ht 5\' 11"  (1.803 m)   Wt 65.8 kg   SpO2 99%   BMI 20.22 kg/m   Physical Exam Vitals and nursing note reviewed.  Constitutional:       General: He is not in acute distress.    Appearance: He is not toxic-appearing.     Comments: Thin male.  HENT:     Head: Normocephalic.     Comments: Temporal wasting    Mouth/Throat:     Mouth: Mucous membranes are moist.  Eyes:     General: Scleral icterus present.  Cardiovascular:     Rate and Rhythm: Normal rate and regular rhythm.     Heart sounds: Normal heart sounds. No murmur heard.  No friction rub. No gallop.   Pulmonary:     Effort: Pulmonary effort is normal. No respiratory distress.     Breath sounds: No stridor. No wheezing, rhonchi or rales.  Chest:     Chest wall: No tenderness.  Abdominal:     General: There is no distension.     Palpations: Abdomen is soft. There is no mass.     Tenderness: There is abdominal tenderness. There is no right CVA tenderness, left CVA tenderness, guarding or rebound.     Hernia: No hernia is present.     Comments: Tender to palpation in the epigastric region.  There is minimal tenderness to palpation in the right upper quadrant.  Negative Murphy sign.  No CVA tenderness bilaterally.  No tenderness over McBurney's point.  Normoactive bowel sounds.  No rebound or guarding.  Musculoskeletal:     Cervical back: Neck supple.  Skin:    General: Skin is warm and dry.     Capillary Refill: Capillary refill takes less than 2 seconds.     Coloration: Skin is jaundiced.  Neurological:     Mental Status: He is alert.  Psychiatric:        Behavior: Behavior normal.     ED Results / Procedures / Treatments   Labs (all labs ordered are listed, but only abnormal results are displayed) Labs Reviewed  COMPREHENSIVE METABOLIC PANEL - Abnormal; Notable for the following components:      Result Value   Sodium 134 (*)    Albumin 3.4 (*)    AST 73 (*)    ALT 104 (*)    Alkaline Phosphatase 267 (*)    Total Bilirubin 12.5 (*)    All other components within normal limits  CBC - Abnormal; Notable for the following components:   WBC 11.2 (*)      All other components within normal limits  URINALYSIS, ROUTINE W REFLEX MICROSCOPIC - Abnormal; Notable for the following components:   Color, Urine AMBER (*)    Bilirubin Urine MODERATE (*)    All other components within normal limits  ACETAMINOPHEN LEVEL - Abnormal; Notable for the following components:   Acetaminophen (Tylenol), Serum <10 (*)    All other components within normal limits  SARS CORONAVIRUS 2 BY RT PCR (HOSPITAL ORDER, PERFORMED IN [facility])  LIPASE, BLOOD  PROTIME-INR  APTT  HEPATITIS PANEL, ACUTE    EKG  None  Radiology CT ABDOMEN PELVIS W CONTRAST  Result Date: 12/08/2019 CLINICAL DATA:  Abdominal pain with biliary obstruction suspected. EXAM: CT ABDOMEN AND PELVIS WITH CONTRAST TECHNIQUE: Multidetector CT imaging of the abdomen and pelvis was performed using the standard protocol following bolus administration of intravenous contrast. CONTRAST:  OMNIPAQUE IOHEXOL 300 MG/ML  SOLN COMPARISON:  Right upper quadrant ultrasound from earlier today FINDINGS: Lower chest:  Dependent atelectasis. Hepatobiliary: No focal liver abnormality.Prominent intrahepatic bile duct dilatation. Extrahepatic bile ducts are also dilated to a lesser degree, 11 mm at the upper hilum. There is a rounded density at the lumen encompassed by low-density bile and measuring 4 mm, seen at the level of the ampulla which is bulging into the duodenum. See sagittal and coronal reformats. The gallbladder is distended with sludge noted on prior ultrasound. Pancreas: Well seen pancreatic duct.  No acute pancreatitis. Spleen: Unremarkable. Adrenals/Urinary Tract: Negative adrenals. No hydronephrosis or stone. Unremarkable bladder. Stomach/Bowel: No obstruction. No evidence of bowel inflammation. Diffuse colonic stool Vascular/Lymphatic: No acute vascular abnormality. Atheromatous calcifications. No mass or adenopathy. Reproductive:No pathologic findings. Other: No ascites or pneumoperitoneum.  Musculoskeletal: No acute abnormalities. Focal degenerative disc narrowing and ridging at L5-S1. IMPRESSION: Biliary obstruction/dilatation above a 4 mm ductal stone at the ampulla. Electronically Signed   By: Marnee Spring M.D.   On: 12/08/2019 08:01   US Abdomen Limited RUQ  Result Date: 12/08/2019 CLINICAL DATA:  Right upper quadrant pain, jaundice EXAM: ULTRASOUND ABDOMEN LIMITED RIGHT UPPER QUADRANT COMPARISON:  None. FINDINGS: Gallbladder: There is sludge within the gallbladder. No stones or wall thickening. Negative sonographic Murphy's. Common bile duct: Diameter: Dilated, 13 mm.  The distal duct is not well visualized. Liver: No focal lesion identified. Within normal limits in parenchymal echogenicity. Portal vein is patent on color Doppler imaging with normal direction of blood flow towards the liver. Other: None. IMPRESSION: Sludge within the gallbladder. Dilated common bile duct. Cannot exclude distal obstructing stone or mass. This could be further evaluated with CT or MRCP. Electronically Signed   By: Charlett Nose M.D.   On: 12/08/2019 01:15    Procedures Procedures (including critical care time)  Medications Ordered in ED Medications  iohexol (OMNIPAQUE) 300 MG/ML solution 100 mL (100 mLs Intravenous Contrast Given 12/08/19 0738)    ED Course  I have reviewed the triage vital signs and the nursing notes.  Pertinent labs & imaging results that were available during my care of the patient were reviewed by me and considered in my medical decision making (see chart for details).  Clinical Course as of Dec 07 912  Sat Dec 08, 2019  0910 WBC(!): 11.2 [MM]  0910 AST(!): 73 [MM]  0910 ALT(!): 104 [MM]  0910 Alkaline Phosphatase(!): 267 [MM]  0910 Total Bilirubin(!): 12.5 [MM]  0910 Bilirubin Urine(!): MODERATE [MM]    Clinical Course User Index [MM] Analyce Tavares, Coral Else, PA-C   MDM Rules/Calculators/A&P                           59 year old male with a history of alcohol use  disorder presenting with vague, longstanding complaints of orange-colored urine.  He is also had a 20 pound weight loss.  Most recently, he has developed epigastric pain, nausea, orange-colored mucus.  His primary concern is that he needs to be tested for hepatitis C.  On exam, vital signs are reassuring.  He has jaundice and scleral icterus.  He is tender to palpation in the  epigastric region without peritonitis.  There is also mild tenderness palpation in the right upper quadrant, but negative Murphy sign.  He had a positive HCV antibody test and 2017.  Repeat hepatitis panel is pending.  LABS: UA with moderate bilirubinuria.  Alkaline phosphatase, AST, ALT, and total bilirubin are all elevated.  Total bilirubin is 12.5.  IMAGING: Right upper quadrant ultrasound with sludge within the gallbladder.  Common bile duct is dilated to 13 mm.  A distal obstructing stone or mass cannot be excluded.  No gallbladder wall thickening or stones visualized.  He had a negative sonographic Murphy's.  The patient was discussed with Dr. Clayborne Dana, attending physician.  We will add on PTT and PT/INR.  Tylenol level is also pending.  Given his chronic symptoms, temporal wasting, and 20 pound weight loss, with essentially no right upper quadrant tenderness, he will require CT scan for further evaluation as I am concerned for malignancy.   Patient care transferred to PA Caccavale at the end of my shift to follow-up on CT abdomen pelvis.  Patient will likely require GI consult and possibly admission. Patient presentation, ED course, and plan of care discussed with review of all pertinent labs and imaging. Please see his/her note for further details regarding further ED course and disposition.  Final Clinical Impression(s) / ED Diagnoses Final diagnoses:  Jaundice  Biliary obstruction    Rx / DC Orders ED Discharge Orders    None       Barkley Boards, PA-C 12/08/19 0914    Mesner, Barbara Cower, MD 12/12/19 0045

## 2019-12-08 NOTE — ED Notes (Signed)
Attempted report. Floor unable to take at this time

## 2019-12-08 NOTE — Plan of Care (Signed)

## 2019-12-08 NOTE — Hospital Course (Addendum)
  *   Matthew Mills *  []  F/u GI rec []  update mother [phone] []  Covid 19 test pending  O/N events:

## 2019-12-08 NOTE — Anesthesia Preprocedure Evaluation (Addendum)
Anesthesia Evaluation  Patient identified by MRN, date of birth, ID band Patient awake    Reviewed: Allergy & Precautions, NPO status , Patient's Chart, lab work & pertinent test results  Airway Mallampati: I  TM Distance: >3 FB Neck ROM: Full    Dental  (+) Edentulous Upper, Edentulous Lower   Pulmonary Current Smoker and Patient abstained from smoking.,    breath sounds clear to auscultation       Cardiovascular negative cardio ROS   Rhythm:Regular Rate:Normal     Neuro/Psych negative neurological ROS  negative psych ROS   GI/Hepatic Neg liver ROS,   Endo/Other    Renal/GU negative Renal ROS     Musculoskeletal   Abdominal Normal abdominal exam  (+)   Peds  Hematology   Anesthesia Other Findings   Reproductive/Obstetrics                            Anesthesia Physical Anesthesia Plan  ASA: II  Anesthesia Plan: General   Post-op Pain Management:    Induction: Intravenous  PONV Risk Score and Plan: 1 and Ondansetron and Treatment may vary due to age or medical condition  Airway Management Planned: Oral ETT  Additional Equipment: None  Intra-op Plan:   Post-operative Plan: Extubation in OR  Informed Consent: I have reviewed the patients History and Physical, chart, labs and discussed the procedure including the risks, benefits and alternatives for the proposed anesthesia with the patient or authorized representative who has indicated his/her understanding and acceptance.       Plan Discussed with: CRNA  Anesthesia Plan Comments: (Lab Results      Component                Value               Date                      WBC                      11.2 (H)            12/07/2019                HGB                      16.4                12/07/2019                HCT                      49.1                12/07/2019                MCV                      91.8                 12/07/2019                PLT                                          12/07/2019  PLATELET CLUMPS NOTED ON SMEAR, COUNT APPEARS ADEQUATE Covid-19 Nucleic Acid Test Results Lab Results      Component                Value               Date                      SARSCOV2NAA              NEGATIVE            12/08/2019           )       Anesthesia Quick Evaluation

## 2019-12-08 NOTE — Progress Notes (Signed)
The patient is alert and oriented.  He has pulled his IV out and reports that he is going home.

## 2019-12-08 NOTE — Anesthesia Procedure Notes (Signed)
Procedure Name: Intubation Date/Time: 12/08/2019 2:05 PM Performed by: Moshe Salisbury, CRNA Pre-anesthesia Checklist: Patient identified, Emergency Drugs available, Suction available and Patient being monitored Patient Re-evaluated:Patient Re-evaluated prior to induction Oxygen Delivery Method: Circle System Utilized Preoxygenation: Pre-oxygenation with 100% oxygen Induction Type: IV induction Ventilation: Mask ventilation without difficulty Laryngoscope Size: Mac and 4 Tube type: Oral Tube size: 8.0 mm Number of attempts: 1 Airway Equipment and Method: Stylet Placement Confirmation: ETT inserted through vocal cords under direct vision,  positive ETCO2 and breath sounds checked- equal and bilateral Secured at: 21 cm Tube secured with: Tape Dental Injury: Teeth and Oropharynx as per pre-operative assessment

## 2019-12-08 NOTE — Op Note (Addendum)
[facility] Patient Name: Matthew Mills Procedure Date : 12/08/2019 MRN: [id number] Attending MD: Meryl Dare , MD Date of Birth: [birth year] CSN: [id number] Age: 59 Admit Type: Inpatient Procedure:                ERCP Indications:              Bile duct stone(s), Abdominal pain of suspected                            biliary origin, Jaundice, Elevated LFTs Providers:                Venita Lick. Russella Dar, MD, Roselie Awkward, RN, Lawson Radar, Technician, Merril Abbe CRNA, CRNA Referring MD:             Dillard Cannon. Criselda Peaches, MD Medicines:                General Anesthesia Complications:            No immediate complications. Estimated Blood Loss:     Estimated blood loss: none. Procedure:                Pre-Anesthesia Assessment:                           - Prior to the procedure, a History and Physical                            was performed, and patient medications and                            allergies were reviewed. The patient's tolerance of                            previous anesthesia was also reviewed. The risks                            and benefits of the procedure and the sedation                            options and risks were discussed with the patient.                            All questions were answered, and informed consent                            was obtained. Prior Anticoagulants: The patient has                            taken no previous anticoagulant or antiplatelet                            agents. ASA Grade Assessment: II - A patient with  mild systemic disease. After reviewing the risks                            and benefits, the patient was deemed in                            satisfactory condition to undergo the procedure.                           After obtaining informed consent, the scope was                            passed under direct vision. Throughout the                             procedure, the patient's blood pressure, pulse, and                            oxygen saturations were monitored continuously. The                            [id number]-[id number] ([id number]) Olympus Duodensocope was                            introduced through the mouth, and used to inject                            contrast into and used to inject contrast into the                            bile duct. The ERCP was accomplished without                            difficulty. The patient tolerated the procedure                            well. Scope In: Scope Out: Findings:      The scout film was normal. The scope was advanced to a normal major       papilla in the descending duodenum. Examination of the pharynx, larynx       and associated structures, and upper GI tract was normal. A straight       Roadrunner wire was passed into the biliary tree. The short-nosed       traction sphincterotome was passed over the guidewire and the bile duct       was then deeply cannulated. Contrast was injected. I personally       interpreted the bile duct images. There was appropriate flow of contrast       through the ducts. The common bile duct was diffusely dilated, with a       single stone causing an obstruction. The largest diameter was 22mm. The       intrahepatic ducts were partially filled and appeared normal. The cystic       duct was filled and the gallbladder was partially filled. A 7 mm biliary  sphincterotomy was made with a traction (standard) sphincterotome using       ERBE electrocautery. There was no post-sphincterotomy bleeding. The       biliary tree was swept with a 12 mm balloon starting at the bifurcation.       Sludge was swept from the duct. One 5 mm stone was removed. No stones       remained. The PD was not cannulated or injected by intention. Excellent       biliary drainage noted post stone removal. Impression:               - The common bile duct was dilated, with a stone                             causing an obstruction.                           - Choledocholithiasis was found. Complete removal                            was accomplished by biliary sphincterotomy and                            balloon extraction.                           - A biliary sphincterotomy was performed.                           - The biliary tree was swept. Stone and sludge were                            removed. Recommendation:           - Return patient to hospital ward for ongoing care.                           - Clear liquid diet for 4 hours then advance as                            tolerated.                           - Observe patient's clinical course following                            today's ERCP with therapeutic intervention.                           - Avoid aspirin and nonsteroidal anti-inflammatory                            medicines for 1 week.                           - Continue IV Unasyn in hospital and then oral  antibiotics as outpatient for a 5 day total course                            of antibiotics.                           - Trend LFTs.                           - Surgical consultation for consideration of                            cholecystectomy. Procedure Code(s):        --- Professional ---                           [phone], Endoscopic retrograde                            cholangiopancreatography (ERCP); with removal of                            calculi/debris from biliary/pancreatic duct(s)                           43262, Endoscopic retrograde                            cholangiopancreatography (ERCP); with                            sphincterotomy/papillotomy Diagnosis Code(s):        --- Professional ---                           K80.51, Calculus of bile duct without cholangitis                            or cholecystitis with obstruction                           R10.9, Unspecified abdominal pain                            R17, Unspecified jaundice CPT copyright 2019 [employer]. All rights reserved. The codes documented in this report are preliminary and upon coder review may  be revised to meet current compliance requirements. Meryl Dare, MD 12/08/2019 3:01:25 PM This report has been signed electronically. Number of Addenda: 0

## 2019-12-08 NOTE — Interval H&P Note (Signed)
History and Physical Interval Note:  12/08/2019 1:14 PM  Matthew Mills  has presented today for surgery, with the diagnosis of CBD stone.  The various methods of treatment have been discussed with the patient and family. After consideration of risks, benefits and other options for treatment, the patient has consented to  Procedure(s): ENDOSCOPIC RETROGRADE CHOLANGIOPANCREATOGRAPHY (ERCP) (N/A) as a surgical intervention.  The patient's history has been reviewed, patient examined, no change in status, stable for surgery.  I have reviewed the patient's chart and labs.  Questions were answered to the patient's satisfaction.     Venita Lick. Russella Dar

## 2019-12-08 NOTE — ED Provider Notes (Signed)
  Physical Exam  BP 122/76   Pulse 66   Temp 98.4 F (36.9 C) (Oral)   Resp 17   Ht 5\' 11"  (1.803 m)   Wt 65.8 kg   SpO2 97%   BMI 20.22 kg/m   Physical Exam  Gen: nontoxic, jaundice Abd: soft, nontender  ED Course/Procedures   Clinical Course as of Dec 08 923  Sat Dec 08, 2019  0910 WBC(!): 11.2 [MM]  0910 AST(!): 73 [MM]  0910 ALT(!): 104 [MM]  0910 Alkaline Phosphatase(!): 267 [MM]  0910 Total Bilirubin(!): 12.5 [MM]  0910 Bilirubin Urine(!): MODERATE [MM]    Clinical Course User Index [MM] McDonald, Mia A, PA-C    Procedures  MDM   Patient signed out to me by Dec 10, 2019, PA-C.  Please see previous notes for further history.  In brief, patient presenting for evaluation of jaundice.  Patient does report epigastric discomfort for the past week, which he states felt like bad heartburn.  He is pain-free at this time.  No nausea or vomiting.  Patient has had a unintentional 20 pound weight loss recently.  Also states of the past year his mucus and urine has been orange.  He reports a history of hep C diagnosed in 2017, but never followed up or had this treated.  He reports intermittent alcohol use, mostly on the weekends.  Labs show hyperbilirubinemia with elevated LFTs.  Ultrasound shows gallbladder sludge and dilation of the biliary duct.  Cannot see if there is a stone.  CT pending.  CT shows biliary obstruction due to stone.  On reassessment, patient remains nontoxic.  Vital signs stable.  No abdominal tenderness at this time.  Will consult with GI and admit to medicine.  Discussed with Dr. 2018 from GI, who will consult on the patient.  Disucssed with Dr. Russella Dar from internal medicine teaching service, patient to be admitted.        Maryla Morrow, PA-C 12/08/19 02/07/20    2094, MD 12/08/19 [phone]

## 2019-12-09 ENCOUNTER — Other Ambulatory Visit: Payer: Self-pay | Admitting: Internal Medicine

## 2019-12-09 ENCOUNTER — Encounter (HOSPITAL_COMMUNITY): Payer: Self-pay | Admitting: Gastroenterology

## 2019-12-09 LAB — HCV RNA QUANT
HCV Quantitative Log: 6.215 log10 IU/mL (ref >1.70)
HCV Quantitative: 1640000 IU/mL (ref >50)

## 2019-12-09 LAB — COMPREHENSIVE METABOLIC PANEL
ALT: 89 U/L — ABNORMAL HIGH (ref 0–44)
AST: 59 U/L — ABNORMAL HIGH (ref 15–41)
Albumin: 2.8 g/dL — ABNORMAL LOW (ref 3.5–5.0)
Alkaline Phosphatase: 202 U/L — ABNORMAL HIGH (ref 38–126)
Anion gap: 8 (ref 5–15)
BUN: 13 mg/dL (ref 6–20)
CO2: 26 mmol/L (ref 22–32)
Calcium: 8.7 mg/dL — ABNORMAL LOW (ref 8.9–10.3)
Chloride: 105 mmol/L (ref 98–111)
Creatinine, Ser: 0.9 mg/dL (ref 0.61–1.24)
GFR calc Af Amer: >60 mL/min (ref >60)
GFR calc non Af Amer: >60 mL/min (ref >60)
Glucose, Bld: 175 mg/dL — ABNORMAL HIGH (ref 70–99)
Potassium: 4.2 mmol/L (ref 3.5–5.1)
Sodium: 139 mmol/L (ref 135–145)
Total Bilirubin: 8.4 mg/dL — ABNORMAL HIGH (ref 0.3–1.2)
Total Protein: 5.8 g/dL — ABNORMAL LOW (ref 6.5–8.1)

## 2019-12-09 LAB — CBC
HCT: 41.5 % (ref 39.0–52.0)
Hemoglobin: 14 g/dL (ref 13.0–17.0)
MCH: 30.2 pg (ref 26.0–34.0)
MCHC: 33.7 g/dL (ref 30.0–36.0)
MCV: 89.4 fL (ref 80.0–100.0)
Platelets: 231 10*3/uL (ref 150–400)
RBC: 4.64 MIL/uL (ref 4.22–5.81)
RDW: 13.4 % (ref 11.5–15.5)
WBC: 8.9 10*3/uL (ref 4.0–10.5)
nRBC: 0 % (ref 0.0–0.2)

## 2019-12-09 MED ORDER — AMOXICILLIN-POT CLAVULANATE 875-125 MG PO TABS: 1.0000 | ORAL_TABLET | Freq: Two times a day (BID) | ORAL | 0 refills | Status: DC

## 2019-12-09 NOTE — Plan of Care (Signed)

## 2019-12-09 NOTE — Progress Notes (Signed)
This is the 3rd IV that the patient has pulled out himself.  The patient is angry and cussing, pacing in the room.  He wants to be discharged.  The MD has been contacted with information regarding the patient behavior and request.

## 2019-12-09 NOTE — Plan of Care (Signed)

## 2019-12-09 NOTE — Progress Notes (Signed)
Pt called and told RN that IV was accidentally removed when trying to go to the bathroom.

## 2019-12-09 NOTE — Progress Notes (Signed)
The patient's room was found empty.  The patient did not receive his discharge paperwork.  He was encouraged to get his discharge information, follow up and prescription information.

## 2019-12-09 NOTE — Progress Notes (Signed)
   Subjective: Patient was seen and evaluated at bedside on morning rounds. No acute events overnight. He tolerated ERCP procedure well yesterday. Tolerated PO diet w/o an issue. He mentions that the color of his urine is still orange. He mentions that he wants to go home and does not want to have any surgery done on him. We explained why he will take benefit of being seen by surgeon for gallbladder surgery. He is not interested in that.   Objective:  Vital signs in last 24 hours: Vitals:   12/08/19 1607 12/08/19 2102 12/09/19 0429 12/09/19 0754  BP: 134/77 112/72 98/69 108/70  Pulse: 60 77 68 69  Resp: 16 15 16 17   Temp: 97.9 F (36.6 C) 97.6 F (36.4 C) 98.2 F (36.8 C) 98.3 F (36.8 C)  TempSrc: Oral Oral Oral Oral  SpO2: 99% 98% 96% 96%  Weight:      Height:        Physical Exam Constitutional:      Appearance: He is not ill-appearing.  Eyes:     General: Scleral icterus present.  Cardiovascular:     Rate and Rhythm: Normal rate.     Heart sounds: No murmur heard.   Abdominal:     Palpations: Abdomen is soft.     Tenderness: There is no abdominal tenderness.  Skin:    Coloration: Skin is jaundiced.  Neurological:     Mental Status: He is alert.    Assessment/Plan:  Active Problems:   Biliary obstruction   Calculus of bile duct without cholangitis with obstruction   Jaundice  Biliary obstruction 2/2 choledocholithiasis  s/p ERCP yesterday Tolerated PO intake. Jaundice improved but still present.  LFT improving. Total Bili improved to 8 from 12. -He is on IV Unasyn. Per GI, will switch to PO Augmenting 857-125 mg BID x 5 days after discharge -Repeat LFT in 1 week -Recommended surgery consult for cholecystectomy given slug in gallbladder. He is not intrested in that. -Will DC home today. Patient is aware of the risk of reoccurrence if do not peruse surgery. GI is informed.  ADDENDUM: Notified by RN that pt wants to go home. Discharge order placed and Abx  prescription sent to his pharmacy. AVS was prepared but per RN, patient left the hospital w/o getting AVS.  HCV Ab +: Hx of untreated HCV. -HCV RNA pending -Will need to f/u with a primary care   Prior to Admission Living Arrangement: Home Anticipated Discharge Location: Home Barriers to Discharge: Surgery consult-Pt discharged home per his preference. Dispo: Anticipated discharge today  , MD 12/09/2019, 8:29 PM Pager: [phone] After 5pm on weekdays and 1pm on weekends: On Call pager [phone]

## 2019-12-09 NOTE — Discharge Summary (Addendum)
Name: Matthew Mills MRN: [id number] DOB: 1960/11/14 59 y.o. PCP: Matthew Mills, No Pcp Per  Date of Admission: 12/07/2019  3:00 PM Date of Discharge: 9/5/20219/5/21 Attending Physician: No att. providers found  Discharge Diagnosis: Active Problems:   Biliary obstruction   Calculus of bile duct without cholangitis with obstruction   Jaundice  1. Obstructive Choledocholithiasis Without Cholangitis 2. Active Hepatitis C Viral Infection  Discharge Medications:  Allergies as of 12/09/2019      Reactions   Iodine Nausea And Vomiting   Shellfish Allergy Nausea And Vomiting      Medication List    TAKE these medications   amoxicillin-clavulanate 875-125 MG tablet Commonly known as: AUGMENTIN Take 1 tablet by mouth 2 (two) times daily. What changed: when to take this       Disposition and follow-up:   Matthew Mills was discharged from [facility] in stable condition.  At the hospital follow up visit please address:  1.   A. Obstructive Choledocholithiasis Without Cholangitis - Obstructive gallstone successfully removed from ampulla via ERCP without complications within PO day 1; however, Matthew Mills left before surgery could discuss cholecystectomy options thoroughly with Matthew Mills. Please be sure Matthew Mills has completed course of Augmentin and does not have signs of recurrent abdominal pain or jaundice or pancreatitis. Please discuss elective cholecystectomy.  B. HCV - Matthew Mills left before he could be made aware of his active HCV infection. Please treat Matthew Mills appropriately. Monitor for cirrhosis, especially given history of AUD.  2.  Labs / imaging needed at time of follow-up: CMP, CBC  3.  Pending labs/ test needing follow-up: None  Follow-up Appointments:  Follow-up Information    [facility]. Call in 1 week(s).   Why: Call and arrange appointment to be checked in 1 week after hospital discharge and to establish primary  care Contact information: [address] [place] [address] [phone]              Hospital Course by problem list:  1. Obstructive Choledocholithiasis Without Cholangitis - Matthew Mills presented to the ED with about 2 weeks of decreased appetite and decreased PO intake followed by 1 week of constant epigastric pain followed by 3 to 4 days of jaundice, dark urine, scleral icterus, and body odor. RUQ ultrasound was performed which showed dilation of the CBD and sludge within the gallbladder. Follow up CT ABD/Pelvis demonstrated an obstructing stone at the ampulla of vater with confirmed CBD dilation. Surgery was consulted, who performed an ERCP with effective stone removal, without complication within PO day 1. T. Bili improved from 12.5 to 8.4, AST improved to 59, ALT improved to 89, and ALP improved to 202. Matthew Mills denied any complications from the procedure but became extremely agitated and was reluctant to stay the night after hospitalization. He signed AMA paperwork but decided to stay the night, and was started on IV Unasyn. He decided to leave within about 1 day of his procedure before surgery had the opportunity to formally consult regarding cholecystectomy options, although Matthew Mills seemed to prefer not to have cholecystectomy. He did not receive his AVS; however, he left in stable condition with GI instructions to switch to PO Augmentin 875-125mg  BID x 5 days following discharge.   2. Active HCV Infection - Matthew Mills had positive HCV antibody testing in August 2017 at an urgent clinic. 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 He denies any other known exposures or history of  IVDU. 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 He was supposed to follow up with GI after antibody testing was positive in 2017 but did not and does not have a PCP doctor. His HCV Ab testing was reactive x 2 this hospitalization. HCV RNA  Quantitative levels were 1.64 million, HCV Quantitative Log was 6.215, consistent with active HCV infection. The Matthew Mills was unable to be formally informed of this infection prior to leaving the hospital. Hepatitis B sAg negative. HIV negative. Albumin slightly low although platelet count appears WNL and CT showed no signs of cirrhosis. No fevers or excessively high AST/ALT to suggest acute viral hepatitis.   Discharge Vitals:   BP 108/70 (BP Location: Left Arm)   Pulse 69   Temp 98.3 F (36.8 C) (Oral)   Resp 17   Ht 5\' 11"  (1.803 m)   Wt 63.5 kg   SpO2 96%   BMI 19.53 kg/m   Pertinent Labs, Studies, and Procedures:   Initial Labs - 12/07/19: CMP - T. Bili 12.5, AST 73, ALT 104, ALP 267, Albumin 3.4. CBC - WBC 11.2, platelets clumped but count appears normal PT - 12.6 INR - 1.0  U/A - moderate bilirubin  Hepatitis Labs: HCV Ab reactive x 2 HCV Quantitative 1.64 million HepBsAg - nonreactive Acetaminophen < 10  Imaging:  ULTRASOUND ABDOMEN LIMITED RIGHT UPPER QUADRANT FINDINGS: There is sludge within the gallbladder. No stones or wall thickening. Negative sonographic Murphy's. Common bile duct: Diameter: Dilated, 13 mm.  The distal duct is not well visualized. Liver: No focal lesion identified. Within normal limits in parenchymal echogenicity. Portal vein is patent on color Doppler imaging with normal direction of blood flow towards the liver. IMPRESSION: Sludge within the gallbladder. Dilated common bile duct. Cannot exclude distal obstructing stone or mass. This could be further evaluated with CT or MRCP. Electronically Signed   By: 02/06/20 M.D.   On: 12/08/2019 01:15  CT ABDOMEN AND PELVIS WITH CONTRAST FINDINGS: Hepatobiliary: No focal liver abnormality.Prominent intrahepatic bile duct dilatation. Extrahepatic bile ducts are also dilated to a lesser degree, 11 mm at the upper hilum. There is a rounded density at the lumen encompassed by low-density bile and measuring  4 mm, seen at the level of the ampulla which is bulging into the duodenum. See sagittal and coronal reformats. The gallbladder is distended with sludge noted on prior ultrasound. Pancreas: Well seen pancreatic duct. No acute pancreatitis. Other: No ascites or pneumoperitoneum.  IMPRESSION: Biliary obstruction/dilatation above a 4 mm ductal stone at the ampulla. Electronically Signed   By: 02/07/2020 M.D.   On: 12/08/2019 08:01  ERCP FINDINGS: Images demonstrate selective cannulation and opacification of the common bile duct which appears mildly dilated. Subsequent images demonstrate insufflation of a balloon within the central aspect of the CBD with subsequent sweeping and biliary sphincterotomy. There is opacification of the cystic duct with minimal opacification of the central aspect of the intrahepatic biliary tree. No discrete filling defects are seen within the opacified portion of the biliary tree. Suspected faint opacification of the central aspect of the pancreatic duct, partially obscured by the overlying endoscope. IMPRESSION: ERCP with biliary sweeping and presumed sphincterotomy as above. Electronically Signed   By: 02/07/2020 M.D.   On: 12/08/2019 15:21    Discharge Instructions: Discharge Instructions    Call MD for:  persistant nausea and vomiting   Complete by: As directed    Call MD for:  severe uncontrolled pain  Complete by: As directed    Call MD for:  temperature >100.4   Complete by: As directed    Diet - low sodium heart healthy   Complete by: As directed    Diet general   Complete by: As directed    Discharge instructions   Complete by: As directed    You were admitted due to jaundice secondary to stone in your biliary tract. (caused by gallstone). The stone was removed by a procedure called ERCP. We are glad that you are feeling better. I understand that you prefer to go home despite the probable risk of leaving hospital that explained to you. Please  take the antibiotic and see a doctor if you develop any fever, chills, worsening of jaundice, abdominal pain, nausea or vomiting.  We prescribed an antibiotic for you.  1-Take Augmentin 1 tablet twice a day for 4 days.  You need to be seen by a primary care doctor in 1 week to repeat blood work for liver function test and also to be evaluated for hepatitis C.   Please make sure you see a doctor in 1 week and establish care with a PCP sometime soon.   Increase activity slowly   Complete by: As directed       Signed: Glenford Bayley, MD 12/09/2019, 9:00 PM   Pager: [phone]

## 2019-12-09 NOTE — Progress Notes (Addendum)
Patient ID: Matthew Mills, male   DOB: 1960/05/15, 59 y.o.   MRN: [id number]    Progress Note   Subjective  Day # 2 CC: jaundice, CBD stone  S/P ERCP with stone extraction x 1 yesterday    Hep C AB + - quant pend  T bili 8.4/ alk phos 202 / AST 59/ ALT 89- improved  WBC 8.9  Patient says he feels fine, is hungry, not happy that he has not had any of his food this morning.  He would like to be discharged from the hospital.  He says that his pain is completely gone and he does not wish to have his gallbladder removed at this time.   Objective   Vital signs in last 24 hours: Temp:  [97.6 F (36.4 C)-98.6 F (37 C)] 98.3 F (36.8 C) (09/05 0754) Pulse Rate:  [60-88] 69 (09/05 0754) Resp:  [15-25] 17 (09/05 0754) BP: (98-135)/(68-77) 108/70 (09/05 0754) SpO2:  [95 %-99 %] 96 % (09/05 0754) Weight:  [63.5 kg] 63.5 kg (09/04 1245) Last BM Date: 12/08/19 General:    white male  in NAD, jaundiced Heart:  Regular rate and rhythm; no murmurs Lungs: Respirations even and unlabored, lungs CTA bilaterally Abdomen:  Soft, nontender and nondistended. Normal bowel sounds. Extremities:  Without edema. Neurologic:  Alert and oriented,  grossly normal neurologically. Psych:  Cooperative. Normal mood and affect.  Intake/Output from previous day: 09/04 0701 - 09/05 0700 In: 1040 [P.O.:240; I.V.:800] Out: 0  Intake/Output this shift: No intake/output data recorded.  Lab Results: Recent Labs    12/07/19 1534 12/09/19 0333  WBC 11.2* 8.9  HGB 16.4 14.0  HCT 49.1 41.5  PLT PLATELET CLUMPS NOTED ON SMEAR, COUNT APPEARS ADEQUATE 231   BMET Recent Labs    12/07/19 1534 12/09/19 0333  NA 134* 139  K 4.3 4.2  CL 98 105  CO2 25 26  GLUCOSE 92 175*  BUN 11 13  CREATININE 1.03 0.90  CALCIUM 9.3 8.7*   LFT Recent Labs    12/09/19 0333  PROT 5.8*  ALBUMIN 2.8*  AST 59*  ALT 89*  ALKPHOS 202*  BILITOT 8.4*   PT/INR Recent Labs    12/08/19 0635  LABPROT 12.6  INR 1.0     Studies/Results: CT ABDOMEN PELVIS W CONTRAST  Result Date: 12/08/2019 CLINICAL DATA:  Abdominal pain with biliary obstruction suspected. EXAM: CT ABDOMEN AND PELVIS WITH CONTRAST TECHNIQUE: Multidetector CT imaging of the abdomen and pelvis was performed using the standard protocol following bolus administration of intravenous contrast. CONTRAST:  168m OMNIPAQUE IOHEXOL 300 MG/ML  SOLN COMPARISON:  Right upper quadrant ultrasound from earlier today FINDINGS: Lower chest:  Dependent atelectasis. Hepatobiliary: No focal liver abnormality.Prominent intrahepatic bile duct dilatation. Extrahepatic bile ducts are also dilated to a lesser degree, 11 mm at the upper hilum. There is a rounded density at the lumen encompassed by low-density bile and measuring 4 mm, seen at the level of the ampulla which is bulging into the duodenum. See sagittal and coronal reformats. The gallbladder is distended with sludge noted on prior ultrasound. Pancreas: Well seen pancreatic duct.  No acute pancreatitis. Spleen: Unremarkable. Adrenals/Urinary Tract: Negative adrenals. No hydronephrosis or stone. Unremarkable bladder. Stomach/Bowel: No obstruction. No evidence of bowel inflammation. Diffuse colonic stool Vascular/Lymphatic: No acute vascular abnormality. Atheromatous calcifications. No mass or adenopathy. Reproductive:No pathologic findings. Other: No ascites or pneumoperitoneum. Musculoskeletal: No acute abnormalities. Focal degenerative disc narrowing and ridging at L5-S1. IMPRESSION: Biliary obstruction/dilatation above a 4  mm ductal stone at the ampulla. Electronically Signed   By: Monte Fantasia M.D.   On: 12/08/2019 08:01   DG ERCP BILIARY & PANCREATIC DUCTS  Result Date: 12/08/2019 CLINICAL DATA:  Biliary obstruction.  Jaundice. EXAM: ERCP TECHNIQUE: Multiple spot images obtained with the fluoroscopic device and submitted for interpretation post-procedure. COMPARISON:  CT abdomen and pelvis-12/08/2019 FLUOROSCOPY  TIME:  1 minute, 41 seconds FINDINGS: Eight spot fluoroscopic images the right upper abdominal quadrant during ERCP are provided for review Initial image demonstrates an ERCP probe overlying the right upper abdominal quadrant Subsequent images demonstrate selective cannulation and opacification of the common bile duct which appears mildly dilated. Subsequent images demonstrate insufflation of a balloon within the central aspect of the CBD with subsequent sweeping and biliary sphincterotomy. There is opacification of the cystic duct with minimal opacification of the central aspect of the intrahepatic biliary tree. No discrete filling defects are seen within the opacified portion of the biliary tree. Suspected faint opacification of the central aspect of the pancreatic duct, partially obscured by the overlying endoscope. IMPRESSION: ERCP with biliary sweeping and presumed sphincterotomy as above. These images were submitted for radiologic interpretation only. Please see the procedural report for the amount of contrast and the fluoroscopy time utilized. Electronically Signed   By: Sandi Mariscal M.D.   On: 12/08/2019 15:21   US Abdomen Limited RUQ  Result Date: 12/08/2019 CLINICAL DATA:  Right upper quadrant pain, jaundice EXAM: ULTRASOUND ABDOMEN LIMITED RIGHT UPPER QUADRANT COMPARISON:  None. FINDINGS: Gallbladder: There is sludge within the gallbladder. No stones or wall thickening. Negative sonographic Murphy's. Common bile duct: Diameter: Dilated, 13 mm.  The distal duct is not well visualized. Liver: No focal lesion identified. Within normal limits in parenchymal echogenicity. Portal vein is patent on color Doppler imaging with normal direction of blood flow towards the liver. Other: None. IMPRESSION: Sludge within the gallbladder. Dilated common bile duct. Cannot exclude distal obstructing stone or mass. This could be further evaluated with CT or MRCP. Electronically Signed   By: Rolm Baptise M.D.   On:  12/08/2019 01:15      Assessment / Plan:     #69  59 year old white male admitted yesterday with jaundice and 1 week history of epigastric pain.  Work-up consistent with choledocholithiasis, and gallbladder sludge. Patient underwent successful ERCP with stone extraction yesterday x1.  Patient feels well today, no persistent pain and LFTs are trending down. Unasyn had been started yesterday prophylactically.  Recommendations:  Advance to regular diet Okay to DC Unasyn and changed to Augmentin 875 p.o. twice daily x 4 days  We had discussed surgical consult yesterday and potential laparoscopic cholecystectomy during this admission.  Patient has decided that he does not want to pursue laparoscopic cholecystectomy at this time.  He does not wish to speak to the surgeons at this time. He can schedule an outpatient appt with General Surgery.  Discussed potential risk for recurrent biliary issues, cholecystitis, choledocholithiasis, pancreatitis. Patient would like to be discharged home today, and is stable from a GI standpoint. No need for GI office follow up at this time.  He should have repeat LFTs in 1 month with his PCP.    LOS: 1 day   Amy Esterwood PA-C 12/09/2019, 8:47 AM      Attending Physician Note   I have taken an interval history, reviewed the chart and examined the patient. I agree with the Advanced Practitioner's note, impression and recommendations.   * Stable post ERCP sphincterotomy, stone  extraction. LFTs improving. Anticipate a few weeks for LFTs to return to their baseline. He declines surgical consult for consideration of cholecystectomy. He can schedule an appt with [facility] as an outpatient if he decides to proceed.  * HCV Ab positive. HCV RNA pending. Refer to ID as outpatient for treatment if HCV RNA positive and if he is interested in treatment.  * Change to Augmentin 875 mg po bid for 4 days.  * No ASA/NSAIDs for 1 week post sphincterotomy.  *  Advance diet.  * OK for discharge home. * No need for outpatient GI follow up at this time.  * Repeat LFTs in 1 month with his PCP.  * GI signing off.   Lucio Edward, MD [facility]

## 2019-12-13 ENCOUNTER — Other Ambulatory Visit: Payer: Self-pay

## 2019-12-13 DIAGNOSIS — B1710 Acute hepatitis C without hepatic coma: Secondary | ICD-10-CM

## 2019-12-17 ENCOUNTER — Encounter: Payer: Self-pay | Admitting: Gastroenterology

## 2019-12-17 NOTE — Progress Notes (Signed)
Meryl Dare, MD sent to Annett Fabian, RN Pt left [facility] on Sunday without being discharged. He was difficult, cussing with the nursing staff. Please discharge from our practice for leaving the hospital against medical advice and poor behavior toward staff. I sent you a separate result note regarding his HCV positive status.     Letter created and dismissal sent to Dr. Russella Dar for signature

## 2019-12-20 ENCOUNTER — Telehealth: Payer: Self-pay | Admitting: Gastroenterology

## 2019-12-20 NOTE — Telephone Encounter (Signed)
Patient dismissed from [facility] by Dr. Claudette Head, effective 12/18/2019. Dismissal letter was sent by registered mail. js

## 2019-12-21 ENCOUNTER — Ambulatory Visit (INDEPENDENT_AMBULATORY_CARE_PROVIDER_SITE_OTHER): Payer: Self-pay | Admitting: Infectious Diseases

## 2019-12-21 ENCOUNTER — Telehealth: Payer: Self-pay

## 2019-12-21 ENCOUNTER — Ambulatory Visit (INDEPENDENT_AMBULATORY_CARE_PROVIDER_SITE_OTHER): Payer: Self-pay

## 2019-12-21 ENCOUNTER — Encounter: Payer: Self-pay | Admitting: Infectious Diseases

## 2019-12-21 ENCOUNTER — Other Ambulatory Visit: Payer: Self-pay

## 2019-12-21 VITALS — BP 122/71 | HR 80 | Temp 97.8°F | Ht 71.0 in | Wt 159.0 lb

## 2019-12-21 DIAGNOSIS — Z23 Encounter for immunization: Secondary | ICD-10-CM

## 2019-12-21 DIAGNOSIS — B182 Chronic viral hepatitis C: Secondary | ICD-10-CM

## 2019-12-21 NOTE — Progress Notes (Addendum)
[facility]                                                             [address], [place], [place], [address]                                                                  Phn. [phone]; Fax: [phone]                                                                           Date: 12/21/19 Reason for Visit: Evaluation of Hepatitis C    HPI: Matthew Mills is a 59 y.o.old male with PMH of hepatitis C who is here for evaluation and management of Hepatitis C. Patient is here with his mother. He says that he is aware about his Hep C positive status for many years now, cant say exactly when but reports feeling not good after 2002. He asserts that he has never used IVD in his life. Denies previous history of blood transfusions prior to 1992, sharing of toothbrushes/razors, or sexual contact with known positive partners.  No personal or family history of liver disease.  He has not received treatment to date. 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 Denies being sexually active since 2002 but was sexually active with females before that. Overall he feels well and denies any complaints today.   Of note, patient was recently admitted to the hospital 9/4-9/5 for obstructive choledocholithiasis and had an ERCP done with extraction of stone. He has completed his course of Augmentin and is not taking any antibiotics currently.   ROS: Denies yellowish discoloration of sclera and skin, abdominal pain/distension, hematemesis.            Dcough, fever, chills, nightsweats, nausea, vomiting, diarrhea, constipation, weight loss, recent hospitalizations, rashes, joint complaints, shortness of breath, chest pain, headaches, dysuria .  Current Outpatient Medications on File Prior to Visit  Medication Sig  Dispense Refill  . amoxicillin-clavulanate (AUGMENTIN) 875-125 MG tablet Take 1 tablet by mouth 2 (two) times daily. (Patient not taking: Reported on 12/21/2019) 8 tablet 0   No current facility-administered medications on file prior to visit.     Allergies  Allergen Reactions  . Iodine Nausea And Vomiting  . Shellfish Allergy Nausea And Vomiting   Past Surgical History:  Procedure Laterality Date  . ERCP N/A 12/08/2019   Procedure: ENDOSCOPIC RETROGRADE CHOLANGIOPANCREATOGRAPHY (ERCP);  Surgeon: Meryl Dare, MD;  Location: [facility] ENDOSCOPY;  Service: Endoscopy;  Laterality: N/A;  . REMOVAL OF STONES  12/08/2019   Procedure: REMOVAL OF STONES;  Surgeon: Meryl Dare, MD;  Location: [facility]  ENDOSCOPY;  Service: Endoscopy;;  . SPHINCTEROTOMY  12/08/2019   Procedure: SPHINCTEROTOMY;  Surgeon: Meryl Dare, MD;  Location: [facility] ENDOSCOPY;  Service: Endoscopy;;   Social History   Socioeconomic History  . Marital status: Single    Spouse name: Not on file  . Number of children: 1  . Years of education: Not on file  . Highest education level: Not on file  Occupational History  . Not on file  Tobacco Use  . Smoking status: Current Every Day Smoker    Packs/day: 0.50  . Smokeless tobacco: Never Used  Substance and Sexual Activity  . Alcohol use: Yes    Comment: occ  . Drug use: Never  . Sexual activity: Not on file  Other Topics Concern  . Not on file  Social History Narrative  . Not on file   Social Determinants of Health   Financial Resource Strain:   . Difficulty of Paying Living Expenses: Not on file  Food Insecurity:   . Worried About Programme researcher, broadcasting/film/video in the Last Year: Not on file  . Ran Out of Food in the Last Year: Not on file  Transportation Needs:   . Lack of Transportation (Medical): Not on file  . Lack of Transportation (Non-Medical): Not on file  Physical Activity:   . Days of Exercise per Week: Not on file  . Minutes of Exercise per Session: Not on file   Stress:   . Feeling of Stress : Not on file  Social Connections:   . Frequency of Communication with Friends and Family: Not on file  . Frequency of Social Gatherings with Friends and Family: Not on file  . Attends Religious Services: Not on file  . Active Member of Clubs or Organizations: Not on file  . Attends Banker Meetings: Not on file  . Marital Status: Not on file  Intimate Partner Violence:   . Fear of Current or Ex-Partner: Not on file  . Emotionally Abused: Not on file  . Physically Abused: Not on file  . Sexually Abused: Not on file     Physical exam: BP 122/71   Pulse 80   Temp 97.8 F (36.6 C) (Oral)   Ht 5\' 11"  (1.803 m)   Wt 159 lb (72.1 kg)   SpO2 97%   BMI 22.18 kg/m   Gen: Alert and oriented x 3, no acute distress HEENT: [place]/AT, PERL, EOMI, no scleral icterus, no pale conjunctivae, hearing normal, oral mucosa moist Neck: Supple, no lymphadenopathy Cardio: Regular rate and rhythm; +S1 and S2; no murmurs, gallops, or rubs Resp: CTAB; no wheezes, rhonchi, or rales GI: Soft, nontender, nondistended, bowel sounds present Extremities: No cyanosis, clubbing, or edema; +2 PT and DP pulses Skin: No rashes, lesions, or ecchymoses Neuro: No focal deficits; CNs II-XII intact; sensation intact; +5/5 MMS b/l UEs and LEs Psych: Calm, cooperative   Laboratory  11/14/2015, 12/08/19 HCV ab reactive  12/08/19  HCV RNA- 1640,000 Hep A IgM Ab NR Hep B surface ag NR  Assessment/Plan: Chronic Hepatitis C Discussed the pathogenesis, transmission, prevention, risks of left untreated, and treatment options for hepatitis C Orders Placed This Encounter  Procedures  . 02/07/20 ABDOMEN COMPLETE W/ELASTOGRAPHY  . Hepatitis A antibody, total  . Hepatitis B core antibody, total  . Hepatitis B surface antibody,qualitative  . Hepatitis C genotype  . Liver Fibrosis, FibroTest-ActiTest   Follow up in 4 weeks with Cassie   Immunization  Willing to get 1st dose of COVID  Vaccine today  I spent greater than 45 minutes with the patient including greater than 50% of time in face to face counsel of the patient and in coordination of their care.    Patient's labs were reviewed as well as his previous records. Patients questions were addressed and answered.   Odette Fraction, MD Infectious Diseases  Office phone [phone] Fax no. [phone]

## 2019-12-21 NOTE — Progress Notes (Signed)
   Covid-19 Vaccination Clinic  Name:  Matthew Mills    MRN: [id number] DOB: Dec 20, 1960  12/21/2019  [name] was observed post Covid-19 immunization for 15 minutes without incident. He was provided with Vaccine Information Sheet and instruction to access the V-Safe system.   [name] was instructed to call 911 with any severe reactions post vaccine: Marland Kitchen Difficulty breathing  . Swelling of face and throat  . A fast heartbeat  . A bad rash all over body  . Dizziness and weakness    Laurell Josephs, RN  Immunizations Administered    Name Date Dose VIS Date Route   Pfizer COVID-19 Vaccine 12/21/2019 10:46 AM 0.3 mL 05/30/2018 Intramuscular   Manufacturer: [employer]   Lot: [id number]   NDC: [id number]

## 2019-12-21 NOTE — Patient Instructions (Signed)
Hepatitis C What is hepatitis C?Hepatitis C is a disease that harms the liver. The liver is a big organ in the upper right side of the belly (figure 1). A virus causes this disease. The virus is called the hepatitis C virus. It spreads from person to person through contact with blood. This can happen in a few ways, including sharing drug needles. What are the symptoms of hepatitis C?Most people with hepatitis C have no symptoms. When symptoms do occur, they can include: ?Feeling tired or weak ?Lack of hunger ?Nausea ?Muscle or joint aches ?Weight loss In most cases, hepatitis C lasts for many years. That can lead to liver scarring, called "cirrhosis." Many people with cirrhosis have no symptoms. When symptoms do occur, they can include: ?Swelling in the belly and legs, and fluid build-up in the lungs  ?Bruising or bleeding easily ?Trouble taking in a full breath ?Feeling full in the belly ?Yellowing of the skin or whites of the eyes, called jaundice ?Confusion that can come on suddenly ?Coma How did I get the disease?You can catch the hepatitis C virus if you have contact with the blood of someone who is infected. This can happen if you: ?Share drug needles or cocaine straws ?Use infected needles for tattooing, acupuncture, or piercings ?Share toothbrushes, razors, or other things that could have blood on them ?Got a blood transfusion in the [place] before 1990 (after that time, blood banks started testing donated blood for hepatitis C) You can catch the hepatitis C virus if you have sex with someone who is infected. But this does not happen very often.  A pregnant woman who is infected can also give hepatitis C to her baby. Some people who have hepatitis C do not remember how they were infected. In the [place], many people with hepatitis C were born between [birth year] and 1965. If you were born during these years, your doctor might want to test you for hepatitis C even if you did not do  any of the things that put you at risk of infection. Is there a test for hepatitis C?Yes. Your doctor might order a few tests: ?Blood tests can show: .If you have hepatitis C .What type of the virus you have (there are at least 6 types) .Which treatment will work best for you If you have hepatitis C, your doctor will also want to know if you have any liver scarring. Ways to check for scarring include: ?Blood tests ?Liver scan - This is a type of imaging test that can show how much scarring you have. Not all doctors have access to the machine that does the scan. ?Biopsy - For this test, a doctor puts a needle into your liver and takes out a small sample of tissue. The sample will show how bad the damage is. Most people with hepatitis C do not need this test.  How is hepatitis C treated?Treatment depends on what type of hepatitis C you have. There are different medicines to treat hepatitis C. Some of them only work on certain forms of the hepatitis C virus. You will have to take a combination of 2 or more medicines based on which virus you have. Treatment usually lasts 3 months. The medicines come in pill form. Your doctor can help you decide which medicines are right for you. Is there anything I can do to protect my liver?Yes, you can: ?Avoid alcohol ?Maintain a healthy weight ?Get vaccinated for hepatitis A and B ?Get vaccinated for pneumonia, the  flu, and other diseases ?Ask your doctor or nurse before taking any over-the-counter pain medicines (these medicines can sometimes damage the liver). ?Avoid marijuana What if I want to get pregnant?If you want to get pregnant, talk to your doctor or nurse first. About 1 in 20 women who have hepatitis C pass the virus on to the baby during pregnancy. That number goes up in women who are also infected with HIV. What will my life be like?Many people with hepatitis C are able to live normal lives. Treatment can cure the disease in almost all cases. If you  have hepatitis C, it is still safe to: ?Hug, kiss, and touch other people (but you can spread the infection through sex) ?Share forks, spoons, cups, and food ?Sneeze or cough ?Breastfeed

## 2019-12-21 NOTE — Telephone Encounter (Signed)
Called to get patient a 4 week follow up scheduled with Cassie instead of Dr. Elinor Parkinson per Dr. Elinor Parkinson, left a voicemail to call us back and get that changed

## 2019-12-28 LAB — LIVER FIBROSIS, FIBROTEST-ACTITEST
ALT: 88 U/L — ABNORMAL HIGH (ref 9–46)
Alpha-2-Macroglobulin: 376 mg/dL — ABNORMAL HIGH (ref 106–279)
Apolipoprotein A1: 140 mg/dL (ref 94–176)
Bilirubin: 1.9 mg/dL — ABNORMAL HIGH (ref 0.2–1.2)
Fibrosis Score: 0.85
GGT: 131 U/L — ABNORMAL HIGH (ref 3–85)
Haptoglobin: 176 mg/dL (ref 43–212)
Necroinflammat ACT Score: 0.71
Reference ID: 3559969

## 2019-12-28 LAB — HEPATITIS B SURFACE ANTIBODY,QUALITATIVE: Hep B S Ab: NONREACTIVE

## 2019-12-28 LAB — HEPATITIS B CORE ANTIBODY, TOTAL: Hep B Core Total Ab: NONREACTIVE

## 2019-12-28 LAB — HEPATITIS A ANTIBODY, TOTAL: Hepatitis A AB,Total: NONREACTIVE

## 2019-12-28 LAB — HEPATITIS C GENOTYPE

## 2020-01-10 ENCOUNTER — Other Ambulatory Visit: Payer: Self-pay

## 2020-01-10 ENCOUNTER — Ambulatory Visit (INDEPENDENT_AMBULATORY_CARE_PROVIDER_SITE_OTHER): Payer: Self-pay | Admitting: Pharmacist

## 2020-01-10 DIAGNOSIS — B182 Chronic viral hepatitis C: Secondary | ICD-10-CM

## 2020-01-10 NOTE — Patient Instructions (Signed)
[facility] [phone] 

## 2020-01-10 NOTE — Progress Notes (Signed)
HPI: Matthew Mills is a 59 y.o. male who presents to the [facility] pharmacy clinic for Hepatitis C follow-up.  Medication: TBD  Start Date: TBD  Hepatitis C Genotype: 1a  Fibrosis Score: F4  Hepatitis C RNA: 1.6 million on 12/08/19  Patient Active Problem List   Diagnosis Date Noted  . Biliary obstruction 12/08/2019  . Calculus of bile duct without cholangitis with obstruction   . Jaundice     Patient's Medications  New Prescriptions   No medications on file  Previous Medications   AMOXICILLIN-CLAVULANATE (AUGMENTIN) 875-125 MG TABLET    Take 1 tablet by mouth 2 (two) times daily.  Modified Medications   No medications on file  Discontinued Medications   No medications on file    Allergies: Allergies  Allergen Reactions  . Iodine Nausea And Vomiting  . Shellfish Allergy Nausea And Vomiting    Past Medical History: No past medical history on file.  Social History: Social History   Socioeconomic History  . Marital status: Single    Spouse name: Not on file  . Number of children: 1  . Years of education: Not on file  . Highest education level: Not on file  Occupational History  . Not on file  Tobacco Use  . Smoking status: Current Every Day Smoker    Packs/day: 0.50  . Smokeless tobacco: Never Used  Substance and Sexual Activity  . Alcohol use: Yes    Comment: occ  . Drug use: Never  . Sexual activity: Not on file  Other Topics Concern  . Not on file  Social History Narrative  . Not on file   Social Determinants of Health   Financial Resource Strain:   . Difficulty of Paying Living Expenses: Not on file  Food Insecurity:   . Worried About Programme researcher, broadcasting/film/video in the Last Year: Not on file  . Ran Out of Food in the Last Year: Not on file  Transportation Needs:   . Lack of Transportation (Medical): Not on file  . Lack of Transportation (Non-Medical): Not on file  Physical Activity:   . Days of Exercise per Week: Not on file  . Minutes of  Exercise per Session: Not on file  Stress:   . Feeling of Stress : Not on file  Social Connections:   . Frequency of Communication with Friends and Family: Not on file  . Frequency of Social Gatherings with Friends and Family: Not on file  . Attends Religious Services: Not on file  . Active Member of Clubs or Organizations: Not on file  . Attends Banker Meetings: Not on file  . Marital Status: Not on file    Labs: Hepatitis C Lab Results  Component Value Date   HCVGENOTYPE 1a 12/21/2019   FIBROSTAGE F4 12/21/2019   Hepatitis B Lab Results  Component Value Date   HEPBSAB NON-REACTIVE 12/21/2019   HEPBSAG NON REACTIVE 12/08/2019   HEPBCAB NON-REACTIVE 12/21/2019   Hepatitis A Lab Results  Component Value Date   HAV NON-REACTIVE 12/21/2019   HIV Lab Results  Component Value Date   HIV Non Reactive 12/08/2019   Lab Results  Component Value Date   CREATININE 0.90 12/09/2019   CREATININE 1.03 12/07/2019   CREATININE 0.94 11/14/2015   Lab Results  Component Value Date   AST 59 (H) 12/09/2019   AST 73 (H) 12/07/2019   AST 48 (H) 11/14/2015   ALT 88 (H) 12/21/2019   ALT 89 (H) 12/09/2019  ALT 104 (H) 12/07/2019   INR 1.0 12/08/2019    Assessment: Matthew Mills and his mother came in today to discuss starting treatment for his chronic Hepatitis C infection.  He has genotype 1 a, F4 cirrhosis, and an initial viral load of 1.6 million.  Labs from one month ago indicate Child Pugh class B decompensation (total bilirubin 8.4 and albumin 2.8), but he was admitted to the hospital at that time for a biliary obstruction. Discussed this with them. They had many questions on transmission of Hepatitis C, treatment duration and tolerability, and coverage. We will check a CMP and CBC today to get a better picture of his lab work before staging him for treatment.   He will come back tomorrow for his COVID vaccine and after his lab work returns, we will decide on treatment and  initiate the process of approval. He knows to look out for a phone call from me or Beryle Quant in the next few weeks with treatment plans.  He will follow up with me 4 weeks after starting. Answered all questions. He will call if he has any more in the meantime.    Plan: - CMP + CBC today - Pfizer COVID vaccine #2/2 tomorrow at 9am - Determine treatment and call when approved  Matthew Mills L. Colman Birdwell, PharmD, BCIDP, AAHIVP, CPP Clinical Pharmacist Practitioner Infectious Diseases Clinical Pharmacist [facility] 01/10/2020, 3:53 PM

## 2020-01-11 ENCOUNTER — Ambulatory Visit: Payer: Self-pay

## 2020-01-11 ENCOUNTER — Ambulatory Visit (INDEPENDENT_AMBULATORY_CARE_PROVIDER_SITE_OTHER): Payer: Self-pay

## 2020-01-11 ENCOUNTER — Ambulatory Visit: Payer: Self-pay | Admitting: Infectious Diseases

## 2020-01-11 DIAGNOSIS — Z23 Encounter for immunization: Secondary | ICD-10-CM

## 2020-01-11 LAB — COMPREHENSIVE METABOLIC PANEL
AG Ratio: 2 (calc) (ref 1.0–2.5)
ALT: 67 U/L — ABNORMAL HIGH (ref 9–46)
AST: 40 U/L — ABNORMAL HIGH (ref 10–35)
Albumin: 4.1 g/dL (ref 3.6–5.1)
Alkaline phosphatase (APISO): 105 U/L (ref 35–144)
BUN: 16 mg/dL (ref 7–25)
CO2: 28 mmol/L (ref 20–32)
Calcium: 9.3 mg/dL (ref 8.6–10.3)
Chloride: 105 mmol/L (ref 98–110)
Creat: 0.85 mg/dL (ref 0.70–1.33)
Globulin: 2.1 g/dL (calc) (ref 1.9–3.7)
Glucose, Bld: 97 mg/dL (ref 65–99)
Potassium: 4.6 mmol/L (ref 3.5–5.3)
Sodium: 140 mmol/L (ref 135–146)
Total Bilirubin: 1.1 mg/dL (ref 0.2–1.2)
Total Protein: 6.2 g/dL (ref 6.1–8.1)

## 2020-01-11 LAB — CBC
HCT: 48.4 % (ref 38.5–50.0)
Hemoglobin: 16.1 g/dL (ref 13.2–17.1)
MCH: 30.5 pg (ref 27.0–33.0)
MCHC: 33.3 g/dL (ref 32.0–36.0)
MCV: 91.7 fL (ref 80.0–100.0)
MPV: 11 fL (ref 7.5–12.5)
Platelets: 202 10*3/uL (ref 140–400)
RBC: 5.28 10*6/uL (ref 4.20–5.80)
RDW: 13.6 % (ref 11.0–15.0)
WBC: 7.5 10*3/uL (ref 3.8–10.8)

## 2020-01-11 NOTE — Progress Notes (Signed)
   Covid-19 Vaccination Clinic  Name:  Matthew Mills    MRN: [id number] DOB: November 27, 1960  01/11/2020  Mr. Matthew Mills was observed post Covid-19 immunization for 15 minutes without incident. He was provided with Vaccine Information Sheet and instruction to access the V-Safe system.   Mr. Matthew Mills was instructed to call 911 with any severe reactions post vaccine: Marland Kitchen Difficulty breathing  . Swelling of face and throat  . A fast heartbeat  . A bad rash all over body  . Dizziness and weakness   Immunizations Administered    Name Date Dose VIS Date Route   Pfizer COVID-19 Vaccine 01/11/2020  9:10 AM 0.3 mL 05/30/2018 Intramuscular   Manufacturer: [employer]   Lot: [id number]   NDC: [address]-9     Sandie Ano, RN

## 2020-01-16 ENCOUNTER — Telehealth: Payer: Self-pay

## 2020-01-16 ENCOUNTER — Ambulatory Visit (INDEPENDENT_AMBULATORY_CARE_PROVIDER_SITE_OTHER): Payer: Self-pay | Admitting: Pharmacist

## 2020-01-16 ENCOUNTER — Other Ambulatory Visit: Payer: Self-pay

## 2020-01-16 DIAGNOSIS — B182 Chronic viral hepatitis C: Secondary | ICD-10-CM

## 2020-01-16 MED ORDER — SOFOSBUVIR-VELPATASVIR 400-100 MG PO TABS: 1.0000 | ORAL_TABLET | Freq: Every day | ORAL | 2 refills | Status: DC

## 2020-01-16 NOTE — Progress Notes (Addendum)
Patient came in to sign paperwork for Epclusa patient assistance. Will print Rx for Dr. Elinor Parkinson to sign and Lupita Leash will submit paperwork for treatment.

## 2020-01-16 NOTE — Telephone Encounter (Addendum)
[facility] Patient Advocate Encounter  Completed and sent [employer] application for EPCLUSA for this patient who is uninsured.    Patient was approved on 01/17/20 to 04/10/20 & pt will get mailed to him from [employer].   This encounter will be updated until final determination.   Clearance Coots, CPhT Specialty Pharmacy Patient [facility] for Infectious Disease Phone: [phone] Fax:  [phone]

## 2020-01-17 ENCOUNTER — Telehealth: Payer: Self-pay

## 2020-01-21 ENCOUNTER — Telehealth: Payer: Self-pay | Admitting: Pharmacist

## 2020-01-21 NOTE — Telephone Encounter (Signed)
Provided Epclusa medication counseling for HCV management. Discussed taking Epclusa 1 tablet daily with food for a total of 12 weeks. Discussed the importance of medication adherence and to contact [facility] if starting any new medications. Patient is currently not taking a PPI and H2RA, however counseled patient to call [facility] if he does decide to start this medication as this can interact with Epclusa and will need additional counseling. Discussed most common side-effects including nausea, fatigue, and headache. Encouraged patient to avoid alcohol intake if possible due to potential liver damage with HCV infection. Patient verbalized understanding. Patient will be contacted when Matthew Mills is ready for pick-up and follow-up visit will be scheduled at that time.   Fabio Neighbors, PharmD PGY2 Ambulatory Care Resident [facility]  Pharmacy

## 2020-01-23 NOTE — Telephone Encounter (Signed)
[facility] Patient Advocate Encounter  Patient's medications have been Delivered  to [facility] from [employer] and will be picked up 01/24/20.  Clearance Coots , CPhT Specialty Pharmacy Patient [facility] for Infectious Disease Phone: [phone] Fax:  [phone]

## 2020-02-12 ENCOUNTER — Telehealth: Payer: Self-pay

## 2020-02-12 NOTE — Telephone Encounter (Signed)
[facility] Patient Advocate Encounter  Patient's 2nd fill of Epclusa generic medication have been delivered to [facility] from FED EX via [employer]  and will be picked up 02/13/20.  Clearance Coots , CPhT Specialty Pharmacy Patient [facility] for Infectious Disease Phone: [phone] Fax:  [phone]

## 2020-03-04 ENCOUNTER — Other Ambulatory Visit: Payer: Self-pay

## 2020-03-04 ENCOUNTER — Ambulatory Visit (INDEPENDENT_AMBULATORY_CARE_PROVIDER_SITE_OTHER): Payer: Self-pay | Admitting: Pharmacist

## 2020-03-04 DIAGNOSIS — Z23 Encounter for immunization: Secondary | ICD-10-CM

## 2020-03-04 DIAGNOSIS — B182 Chronic viral hepatitis C: Secondary | ICD-10-CM

## 2020-03-04 NOTE — Progress Notes (Signed)
HPI: Matthew Mills is a 59 y.o. male who presents to the [facility] pharmacy clinic for Hepatitis C follow-up.  Medication: Epclusa x 12 weeks   Start Date: 01/25/20  Hepatitis C Genotype: 1a  Fibrosis Score: F4  Hepatitis C RNA: 1.6 milion  Patient Active Problem List   Diagnosis Date Noted  . Biliary obstruction 12/08/2019  . Calculus of bile duct without cholangitis with obstruction   . Jaundice     Patient's Medications  New Prescriptions   No medications on file  Previous Medications   AMOXICILLIN-CLAVULANATE (AUGMENTIN) 875-125 MG TABLET    Take 1 tablet by mouth 2 (two) times daily.   SOFOSBUVIR-VELPATASVIR (EPCLUSA) 400-100 MG TABS    Take 1 tablet by mouth daily.  Modified Medications   No medications on file  Discontinued Medications   No medications on file    Allergies: Allergies  Allergen Reactions  . Iodine Nausea And Vomiting  . Shellfish Allergy Nausea And Vomiting    Past Medical History: No past medical history on file.  Social History: Social History   Socioeconomic History  . Marital status: Single    Spouse name: Not on file  . Number of children: 1  . Years of education: Not on file  . Highest education level: Not on file  Occupational History  . Not on file  Tobacco Use  . Smoking status: Current Every Day Smoker    Packs/day: 0.50  . Smokeless tobacco: Never Used  Substance and Sexual Activity  . Alcohol use: Yes    Comment: occ  . Drug use: Never  . Sexual activity: Not on file  Other Topics Concern  . Not on file  Social History Narrative  . Not on file   Social Determinants of Health   Financial Resource Strain:   . Difficulty of Paying Living Expenses: Not on file  Food Insecurity:   . Worried About Programme researcher, broadcasting/film/video in the Last Year: Not on file  . Ran Out of Food in the Last Year: Not on file  Transportation Needs:   . Lack of Transportation (Medical): Not on file  . Lack of Transportation (Non-Medical): Not  on file  Physical Activity:   . Days of Exercise per Week: Not on file  . Minutes of Exercise per Session: Not on file  Stress:   . Feeling of Stress : Not on file  Social Connections:   . Frequency of Communication with Friends and Family: Not on file  . Frequency of Social Gatherings with Friends and Family: Not on file  . Attends Religious Services: Not on file  . Active Member of Clubs or Organizations: Not on file  . Attends Banker Meetings: Not on file  . Marital Status: Not on file    Labs: Hepatitis C Lab Results  Component Value Date   HCVGENOTYPE 1a 12/21/2019   FIBROSTAGE F4 12/21/2019   Hepatitis B Lab Results  Component Value Date   HEPBSAB NON-REACTIVE 12/21/2019   HEPBSAG NON REACTIVE 12/08/2019   HEPBCAB NON-REACTIVE 12/21/2019   Hepatitis A Lab Results  Component Value Date   HAV NON-REACTIVE 12/21/2019   HIV Lab Results  Component Value Date   HIV Non Reactive 12/08/2019   Lab Results  Component Value Date   CREATININE 0.85 01/10/2020   CREATININE 0.90 12/09/2019   CREATININE 1.03 12/07/2019   CREATININE 0.94 11/14/2015   Lab Results  Component Value Date   AST 40 (H) 01/10/2020   AST  59 (H) 12/09/2019   AST 73 (H) 12/07/2019   ALT 67 (H) 01/10/2020   ALT 88 (H) 12/21/2019   ALT 89 (H) 12/09/2019   INR 1.0 12/08/2019    Assessment: Matthew Mills presents to clinic for his 20-month hepatitis C follow-up today. He has been taking his Epclusa every morning without any missed doses. He noticed some mild fatigue in the first couple weeks of treatment that has since faded. Otherwise, he has been doing well on treatment. He picked up his second month of medications at [facility] mid-November and has ~2 weeks left. Will check HCV RNA today as well as CMet given elevated LFTs at initial visit. Given Cassie will be on maternity leave at end of treatment, will have him follow-up with Dr. Elinor Parkinson for SVR appointment 3 months after completing  therapy.   Patient has completed his 2-dose Pfizer series in October, so he will be eligible for booster in a few months. He inquired about the booster, and I told him we have COVID vaccine clinic on Fridays that he can schedule with later.He declined the flu shot today. Of note, he is not immune to hepatitis A or hepatitis B, so will start both vaccination series today. Will schedule 23-month follow-up for second hepatitis B shot. Will set up 52-month follow-up for his 2nd hepatitis A shot later.  Plan: Check HCV RNA and CMet today Administered hepatitis B and hepatitis A vaccines #1 Administer hepatitis B vaccine #2 on 04/03/20 @ 9:15AM  Follow-up with Dr. Lia Foyer on 08/02/19 @ 1:45PM  Margarite Gouge, PharmD PGY2 ID Pharmacy Resident [phone]  03/04/2020, 10:06 AM

## 2020-03-07 LAB — COMPREHENSIVE METABOLIC PANEL
AG Ratio: 1.8 (calc) (ref 1.0–2.5)
ALT: 11 U/L (ref 9–46)
AST: 15 U/L (ref 10–35)
Albumin: 4.4 g/dL (ref 3.6–5.1)
Alkaline phosphatase (APISO): 90 U/L (ref 35–144)
BUN: 13 mg/dL (ref 7–25)
CO2: 27 mmol/L (ref 20–32)
Calcium: 9.5 mg/dL (ref 8.6–10.3)
Chloride: 102 mmol/L (ref 98–110)
Creat: 0.91 mg/dL (ref 0.70–1.33)
Globulin: 2.4 g/dL (calc) (ref 1.9–3.7)
Glucose, Bld: 93 mg/dL (ref 65–99)
Potassium: 4.4 mmol/L (ref 3.5–5.3)
Sodium: 138 mmol/L (ref 135–146)
Total Bilirubin: 0.5 mg/dL (ref 0.2–1.2)
Total Protein: 6.8 g/dL (ref 6.1–8.1)

## 2020-03-07 LAB — HEPATITIS C RNA QUANTITATIVE
HCV Quantitative Log: <1.18 log IU/mL
HCV RNA, PCR, QN: <15 IU/mL

## 2020-03-10 ENCOUNTER — Telehealth: Payer: Self-pay

## 2020-03-10 NOTE — Telephone Encounter (Signed)
[facility] Patient Advocate Encounter   Patient's 3rd fill of Epclusa generic medication have been delivered to [facility] from FED EX via [employer] and will be picked up on 03/11/20.    Matthew Mills , CPhT Specialty Pharmacy Patient [facility] for Infectious Disease Phone: [phone] Fax:  [phone]

## 2020-04-03 ENCOUNTER — Other Ambulatory Visit: Payer: Self-pay

## 2020-04-03 ENCOUNTER — Ambulatory Visit (INDEPENDENT_AMBULATORY_CARE_PROVIDER_SITE_OTHER): Payer: Self-pay | Admitting: Pharmacist

## 2020-04-03 DIAGNOSIS — Z23 Encounter for immunization: Secondary | ICD-10-CM

## 2020-04-03 NOTE — Progress Notes (Signed)
   [facility] Vaccination Visit  HPI: Matthew Mills is a 59 y.o. male who presents to the [facility] pharmacy clinic for vaccine administration.  Hepatitis B Lab Results  Component Value Date   HEPBSAB NON-REACTIVE 12/21/2019   Lab Results  Component Value Date   HEPBSAG NON REACTIVE 12/08/2019    Hepatitis C Lab Results  Component Value Date   HCVAB Reactive (A) 12/08/2019    Hepatitis A Lab Results  Component Value Date   HAV NON-REACTIVE 12/21/2019    Assessment & Plan: - Administered second and final Hepatitis B vaccine - Patient tolerated well - Next appointment scheduled with Dr. Elinor Parkinson on 4/29 at 145pm  Matthew Mills L. Fernando Stoiber, PharmD, BCIDP, AAHIVP, CPP Clinical Pharmacist Practitioner Infectious Diseases Clinical Pharmacist [facility] 04/03/2020, 10:37 AM

## 2020-08-01 ENCOUNTER — Ambulatory Visit: Payer: Self-pay | Admitting: Infectious Diseases

## 2020-09-26 ENCOUNTER — Other Ambulatory Visit: Payer: Self-pay

## 2020-09-26 ENCOUNTER — Ambulatory Visit (INDEPENDENT_AMBULATORY_CARE_PROVIDER_SITE_OTHER): Payer: Self-pay | Admitting: Infectious Diseases

## 2020-09-26 VITALS — BP 111/73 | HR 68 | Resp 16 | Ht 71.0 in | Wt 160.8 lb

## 2020-09-26 DIAGNOSIS — B182 Chronic viral hepatitis C: Secondary | ICD-10-CM

## 2020-09-26 DIAGNOSIS — Z23 Encounter for immunization: Secondary | ICD-10-CM

## 2020-09-26 NOTE — Progress Notes (Signed)
[facility]                                                             [address], [place], [place], [address]                                                                  Phn. [phone]; Fax: [phone]                                                                           Date: 09/26/20 Reason for Visit: Evaluation of Hepatitis C    HPI: Matthew Mills is a 60 y.o.old male with PMH of hepatitis C who is here for evaluation and management of Hepatitis C. Patient is here with his mother. He says that he is aware about his Hep C positive status for many years now, cant say exactly when but reports feeling not good after 2002. He asserts that he has never used IVD in his life. Denies previous history of blood transfusions prior to 1992, sharing of toothbrushes/razors, or sexual contact with known positive partners.  No personal or family history of liver disease.  He has not received treatment to date. 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 Denies being sexually active since 2002 but was sexually active with females before that. Overall he feels well and denies any complaints today.   Of note, patient was recently admitted to the hospital 9/4-9/5 for obstructive choledocholithiasis and had an ERCP done with extraction of stone. He has completed his course of Augmentin and is not taking any antibiotics currently.   09/26/20 Doing well.  He has completed 12 weeks of Epclusa.  Denies missing any doses.  He has also been vaccinated for hepatitis B, and received 1 dose of hepatitis A.  He is willing to get second dose of hepatitis A. We also discussed about getting a COVID booster.  He continues to drink alcohol occasionally.  Smokes 15 cigarettes a day.  Counseled on smoking and alcohol.  He  complains of occasional dizziness.  I discussed with him to follow-up with his PCP.  Discussed with him to get HCVRNA to make sure hepatitis C is cleared from his body and need to schedule ultrasound abdomen to rule out cirrhosis.  He is agreeable to the plan.   ROS: Denies yellowish discoloration of sclera and skin, abdominal pain/distension, hematemesis.            Dcough, fever, chills, nightsweats, nausea, vomiting, diarrhea, constipation, weight loss, recent hospitalizations, rashes, joint complaints, shortness of breath, chest pain, headaches, dysuria .  No current outpatient medications on file prior  to visit.   No current facility-administered medications on file prior to visit.    Allergies  Allergen Reactions   Iodine Nausea And Vomiting   Shellfish Allergy Nausea And Vomiting   Past Surgical History:  Procedure Laterality Date   ERCP N/A 12/08/2019   Procedure: ENDOSCOPIC RETROGRADE CHOLANGIOPANCREATOGRAPHY (ERCP);  Surgeon: Meryl Dare, MD;  Location: [facility] ENDOSCOPY;  Service: Endoscopy;  Laterality: N/A;   REMOVAL OF STONES  12/08/2019   Procedure: REMOVAL OF STONES;  Surgeon: Meryl Dare, MD;  Location: [facility] ENDOSCOPY;  Service: Endoscopy;;   SPHINCTEROTOMY  12/08/2019   Procedure: SPHINCTEROTOMY;  Surgeon: Meryl Dare, MD;  Location: [facility] ENDOSCOPY;  Service: Endoscopy;;   Social History   Socioeconomic History   Marital status: Single    Spouse name: Not on file   Number of children: 1   Years of education: Not on file   Highest education level: Not on file  Occupational History   Not on file  Tobacco Use   Smoking status: Every Day    Packs/day: 0.50    Pack years: 0.00    Types: Cigarettes   Smokeless tobacco: Never  Substance and Sexual Activity   Alcohol use: Yes    Comment: occ   Drug use: Never   Sexual activity: Not on file  Other Topics Concern   Not on file  Social History Narrative   Not on file   Social Determinants of Health    Financial Resource Strain: Not on file  Food Insecurity: Not on file  Transportation Needs: Not on file  Physical Activity: Not on file  Stress: Not on file  Social Connections: Not on file  Intimate Partner Violence: Not on file    Vitals BP 111/73   Pulse 68   Resp 16   Ht 5\' 11"  (1.803 m)   Wt 160 lb 12.8 oz (72.9 kg)   SpO2 98%   BMI 22.43 kg/m    Gen: Alert and oriented x 3, no acute distress HEENT: [place]/AT, no scleral icterus, no pale conjunctivae, hearing normal, oral mucosa moist Neck: Supple, no lymphadenopathy Cardio: Regular rate and rhythm; +S1 and S2; no murmurs, gallops, or rubs Resp: CTAB; no wheezes, rhonchi, or rales GI: Soft, nontender, nondistended Extremities: No pedal edema Skin: No rashes, lesions, or ecchymoses Neuro: grossly non focal  Psych: Calm, cooperative  Laboratory  11/14/2015, 12/08/19 HCV ab reactive  12/08/19  HCV RNA- 1640,000 Hep A IgM Ab NR Hep B surface ag NR  Assessment/Plan: Chronic Hepatitis C S/p 12 months of Epclusa  Check 3 months SVR today 02/07/20 abdomen needs to be rescheduled   Immunization  2nd dose of Hepatitis A Will also need COVID Booster  I spent greater than 35 minutes with the patient including greater than 50% of time in face to face counsel of the patient and in coordination of their care.   Patient's labs were reviewed as well as his previous records. Patients questions were addressed and answered.   [place], MD Infectious Diseases  Office phone [phone] Fax no. [phone]

## 2020-09-28 LAB — HEPATITIS C RNA QUANTITATIVE
HCV Quantitative Log: <1.18 log IU/mL
HCV RNA, PCR, QN: <15 IU/mL

## 2020-09-30 ENCOUNTER — Ambulatory Visit (HOSPITAL_COMMUNITY): Admission: RE | Admit: 2020-09-30 | Payer: Self-pay | Source: Ambulatory Visit

## 2021-03-08 IMAGING — CT CT ABD-PELV W/ CM
2 of 5 series · 16 of 46 positions shown, 18 images · IV contrast (APPLIED)
Comparison: Right upper quadrant ultrasound from earlier today

CLINICAL DATA: Abdominal pain with biliary obstruction suspected.

EXAM:
CT ABDOMEN AND PELVIS WITH CONTRAST
TECHNIQUE: Multidetector CT imaging of the abdomen and pelvis was performed
using the standard protocol following bolus administration of
intravenous contrast.
CONTRAST:  100mL OMNIPAQUE IOHEXOL 300 MG/ML  SOLN

[Series 3: abd/ pelvis 5.0 i30f 2 · axial · 0.83mm/px · z∈[+815,+1240]mm · 13 of 97 slices shown, 15 images]
[im 6/97  soft-tissue]
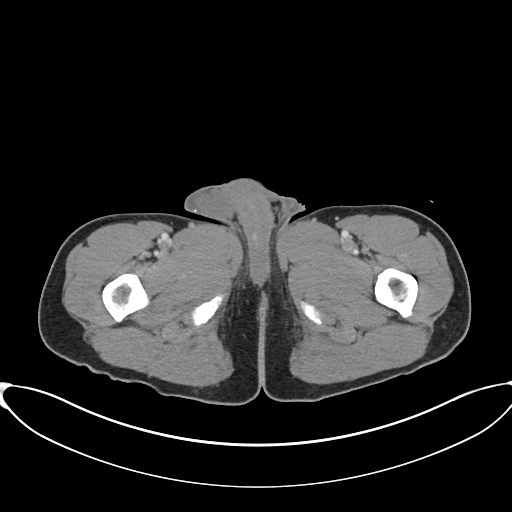
[im 6/97  bone]
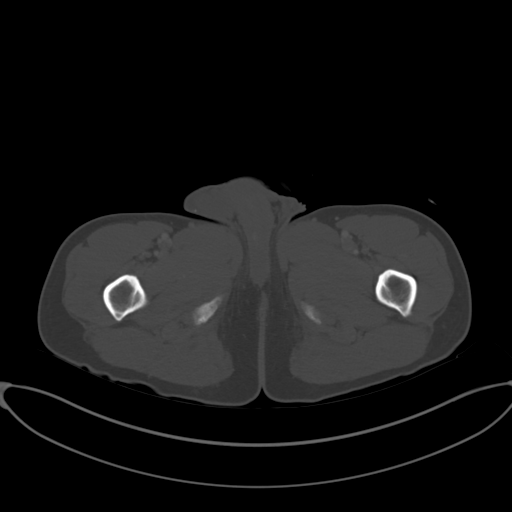
[im 11/97  soft-tissue]
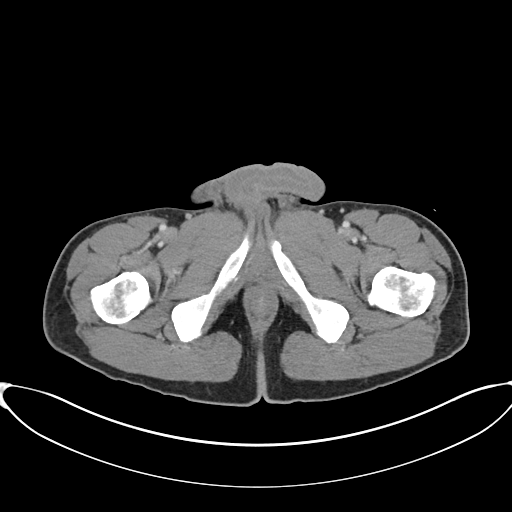
[im 22/97  soft-tissue]
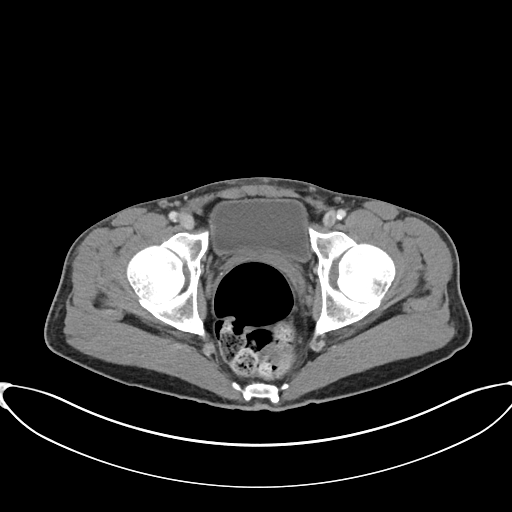
[im 27/97  soft-tissue]
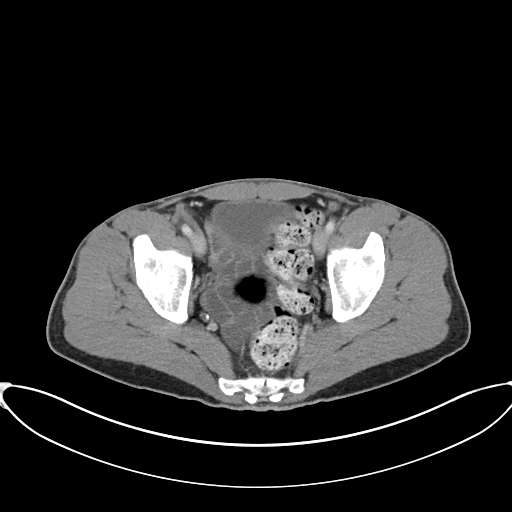
[im 33/97  soft-tissue]
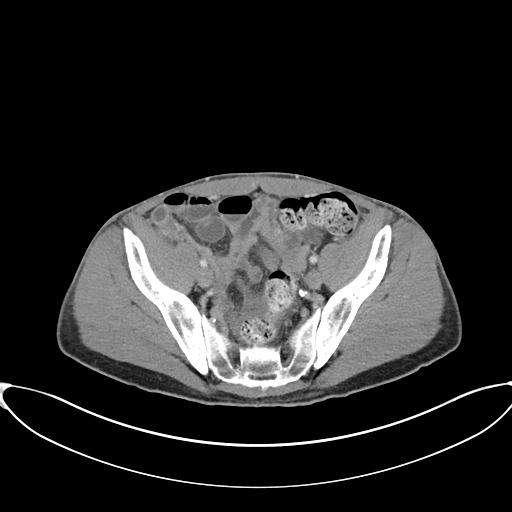
[im 43/97  soft-tissue]
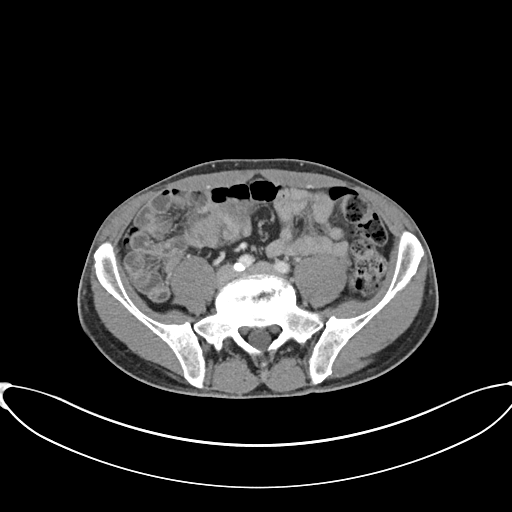
[im 49/97  soft-tissue]
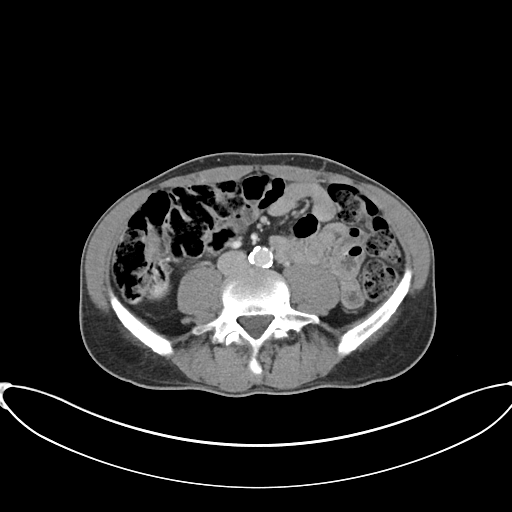
[im 54/97  soft-tissue]
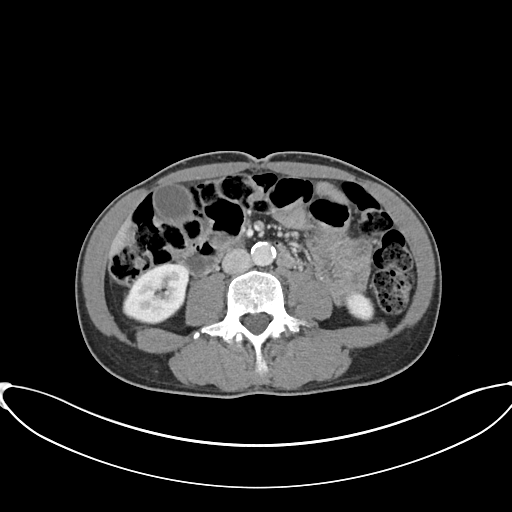
[im 65/97  soft-tissue]
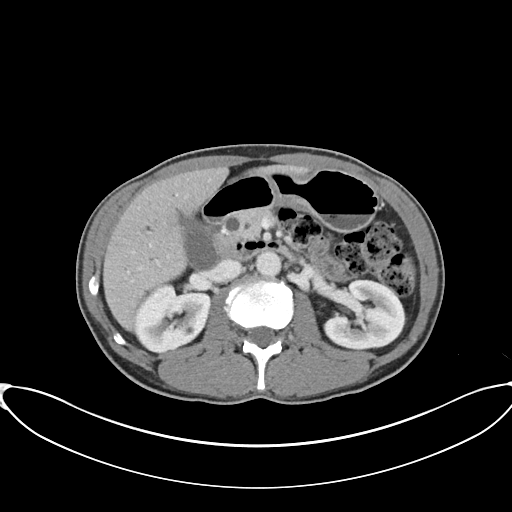
[im 65/97  bone]
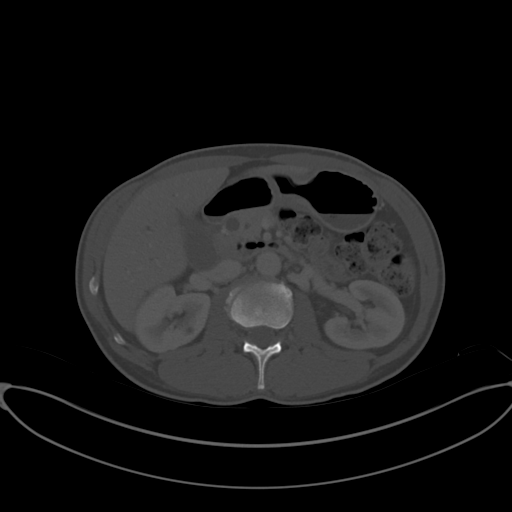
[im 70/97  soft-tissue]
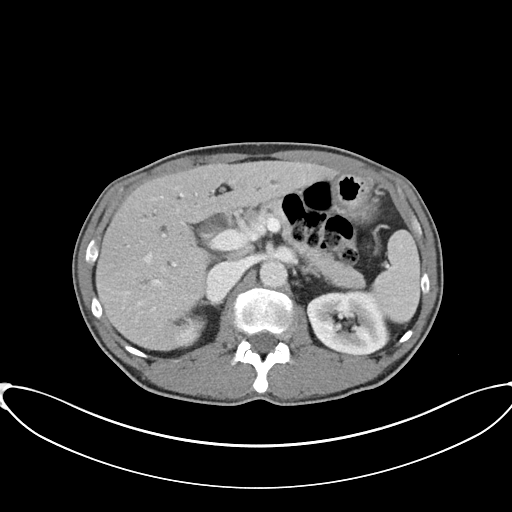
[im 75/97  soft-tissue]
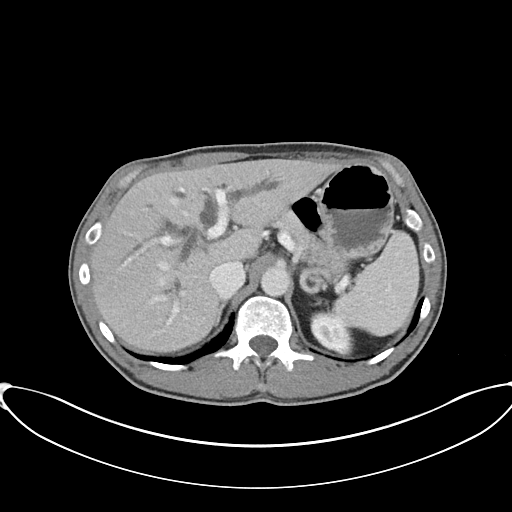
[im 86/97  soft-tissue]
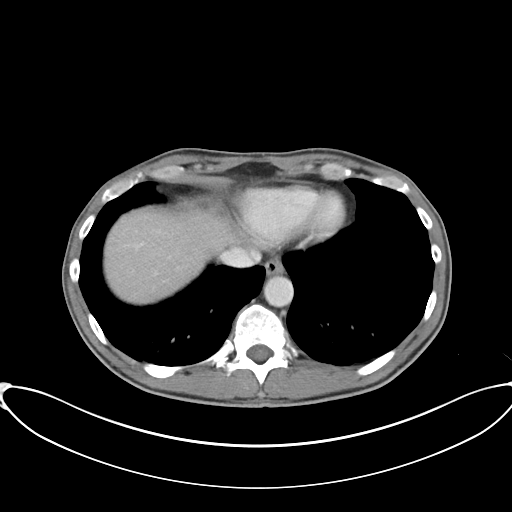
[im 91/97  soft-tissue]
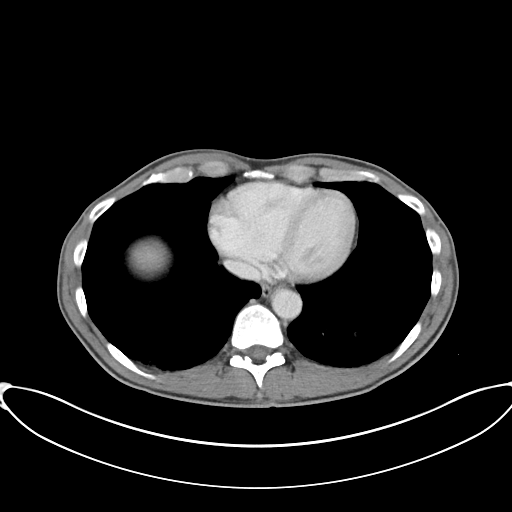

[Series 6: coronal soft tissue · coronal · 0.71mm/px · 3 of 101 slices shown]
[im 34/101  soft-tissue]
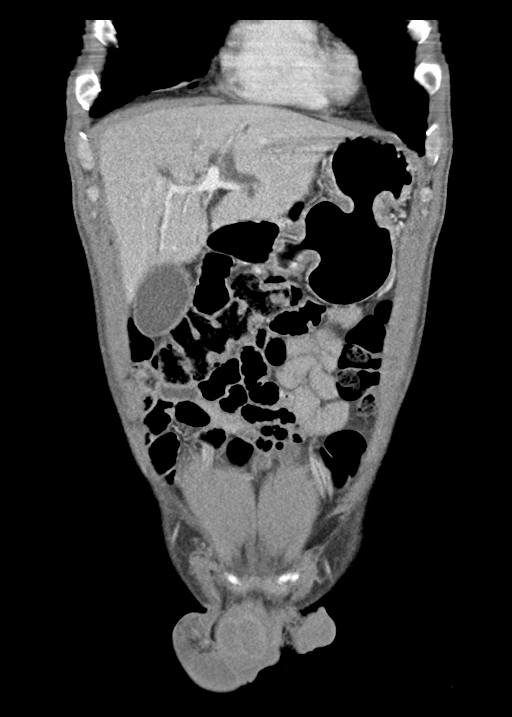
[im 45/101  soft-tissue]
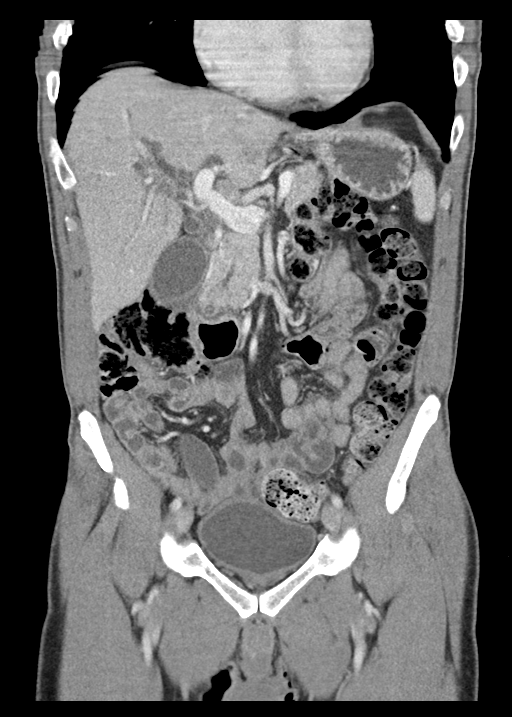
[im 56/101  soft-tissue]
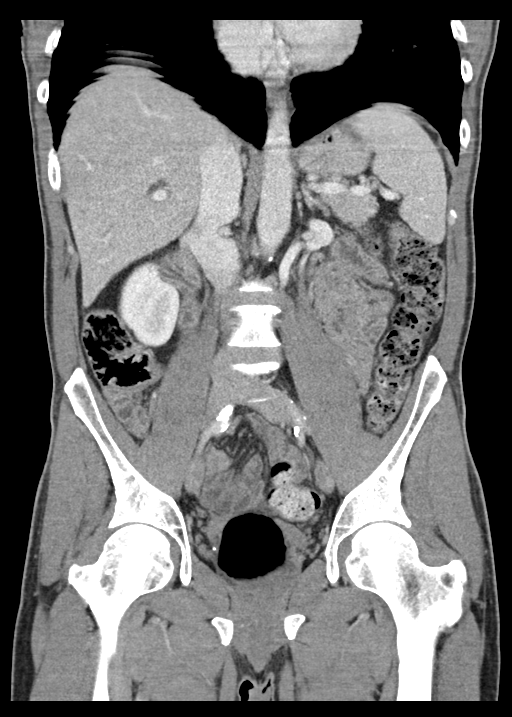

[16 of 46 positions shown; findings below may reference images not displayed]

FINDINGS: Lower chest:  Dependent atelectasis.

Hepatobiliary: No focal liver abnormality.Prominent intrahepatic
bile duct dilatation. Extrahepatic bile ducts are also dilated to a
lesser degree, 11 mm at the upper hilum. There is a rounded density
at the lumen encompassed by low-density bile and measuring 4 mm,
seen at the level of the ampulla which is bulging into the duodenum.
See sagittal and coronal reformats. The gallbladder is distended
with sludge noted on prior ultrasound.

Pancreas: Well seen pancreatic duct.  No acute pancreatitis.

Spleen: Unremarkable.

Adrenals/Urinary Tract: Negative adrenals. No hydronephrosis or
stone. Unremarkable bladder.

Stomach/Bowel: No obstruction. No evidence of bowel inflammation.
Diffuse colonic stool

Vascular/Lymphatic: No acute vascular abnormality. Atheromatous
calcifications. No mass or adenopathy.

Reproductive:No pathologic findings.

Other: No ascites or pneumoperitoneum.

Musculoskeletal: No acute abnormalities. Focal degenerative disc
narrowing and ridging at L5-S1.
IMPRESSION: Biliary obstruction/dilatation above a 4 mm ductal stone at the
ampulla.
